# Patient Record
Sex: Male | Born: 1985 | Race: White | Hispanic: No | Marital: Married | Smoking: Current every day smoker
Health system: Southern US, Community
[De-identification: ages and names within clinical notes are randomized; demographics above are authoritative.]

## PROBLEM LIST (undated history)

## (undated) DIAGNOSIS — F909 Attention-deficit hyperactivity disorder, unspecified type: Secondary | ICD-10-CM

## (undated) DIAGNOSIS — F329 Major depressive disorder, single episode, unspecified: Secondary | ICD-10-CM

## (undated) DIAGNOSIS — F32A Depression, unspecified: Secondary | ICD-10-CM

## (undated) DIAGNOSIS — F4310 Post-traumatic stress disorder, unspecified: Secondary | ICD-10-CM

## (undated) HISTORY — PX: TONSILLECTOMY: SUR1361

---

## 2013-04-20 ENCOUNTER — Encounter (HOSPITAL_BASED_OUTPATIENT_CLINIC_OR_DEPARTMENT_OTHER): Payer: Self-pay | Admitting: Emergency Medicine

## 2013-04-20 ENCOUNTER — Emergency Department (HOSPITAL_BASED_OUTPATIENT_CLINIC_OR_DEPARTMENT_OTHER)
Admission: EM | Admit: 2013-04-20 | Discharge: 2013-04-21 | Disposition: A | Discharge status: Other Acute Inpatient Hospital | Payer: Self-pay | Attending: Emergency Medicine | Admitting: Emergency Medicine

## 2013-04-20 DIAGNOSIS — F909 Attention-deficit hyperactivity disorder, unspecified type: Secondary | ICD-10-CM | POA: Insufficient documentation

## 2013-04-20 DIAGNOSIS — F17200 Nicotine dependence, unspecified, uncomplicated: Secondary | ICD-10-CM | POA: Insufficient documentation

## 2013-04-20 DIAGNOSIS — F4310 Post-traumatic stress disorder, unspecified: Secondary | ICD-10-CM | POA: Insufficient documentation

## 2013-04-20 DIAGNOSIS — F3289 Other specified depressive episodes: Secondary | ICD-10-CM | POA: Insufficient documentation

## 2013-04-20 DIAGNOSIS — R45851 Suicidal ideations: Secondary | ICD-10-CM | POA: Insufficient documentation

## 2013-04-20 DIAGNOSIS — F32A Depression, unspecified: Secondary | ICD-10-CM

## 2013-04-20 DIAGNOSIS — F329 Major depressive disorder, single episode, unspecified: Secondary | ICD-10-CM

## 2013-04-20 HISTORY — DX: Post-traumatic stress disorder, unspecified: F43.10

## 2013-04-20 HISTORY — DX: Attention-deficit hyperactivity disorder, unspecified type: F90.9

## 2013-04-20 HISTORY — DX: Depression, unspecified: F32.A

## 2013-04-20 HISTORY — DX: Major depressive disorder, single episode, unspecified: F32.9

## 2013-04-20 LAB — CBC WITH DIFFERENTIAL/PLATELET
Basophils Absolute: 0 10*3/uL (ref 0.0–0.1)
Basophils Relative: 0 % (ref 0–1)
Eosinophils Absolute: 0.2 10*3/uL (ref 0.0–0.7)
Eosinophils Relative: 2 % (ref 0–5)
HCT: 40.1 % (ref 39.0–52.0)
HEMOGLOBIN: 13.5 g/dL (ref 13.0–17.0)
LYMPHS PCT: 18 % (ref 12–46)
Lymphs Abs: 1.7 10*3/uL (ref 0.7–4.0)
MCH: 29.9 pg (ref 26.0–34.0)
MCHC: 33.7 g/dL (ref 30.0–36.0)
MCV: 88.9 fL (ref 78.0–100.0)
MONO ABS: 0.8 10*3/uL (ref 0.1–1.0)
Monocytes Relative: 8 % (ref 3–12)
NEUTROS ABS: 6.8 10*3/uL (ref 1.7–7.7)
NEUTROS PCT: 72 % (ref 43–77)
Platelets: 274 10*3/uL (ref 150–400)
RBC: 4.51 MIL/uL (ref 4.22–5.81)
RDW: 13.5 % (ref 11.5–15.5)
WBC: 9.4 10*3/uL (ref 4.0–10.5)

## 2013-04-20 LAB — RAPID URINE DRUG SCREEN, HOSP PERFORMED
Amphetamines: NOT DETECTED
BARBITURATES: NOT DETECTED
Benzodiazepines: NOT DETECTED
COCAINE: NOT DETECTED
Opiates: NOT DETECTED
TETRAHYDROCANNABINOL: NOT DETECTED

## 2013-04-20 LAB — COMPREHENSIVE METABOLIC PANEL
ALBUMIN: 4.3 g/dL (ref 3.5–5.2)
ALK PHOS: 58 U/L (ref 39–117)
ALT: 10 U/L (ref 0–53)
AST: 13 U/L (ref 0–37)
BILIRUBIN TOTAL: 0.2 mg/dL — AB (ref 0.3–1.2)
BUN: 10 mg/dL (ref 6–23)
CHLORIDE: 105 meq/L (ref 96–112)
CO2: 26 meq/L (ref 19–32)
CREATININE: 0.8 mg/dL (ref 0.50–1.35)
Calcium: 9.2 mg/dL (ref 8.4–10.5)
GFR calc Af Amer: >90 mL/min (ref >90)
Glucose, Bld: 99 mg/dL (ref 70–99)
POTASSIUM: 4 meq/L (ref 3.7–5.3)
Sodium: 145 mEq/L (ref 137–147)
Total Protein: 7.1 g/dL (ref 6.0–8.3)

## 2013-04-20 LAB — SALICYLATE LEVEL

## 2013-04-20 LAB — ACETAMINOPHEN LEVEL

## 2013-04-20 LAB — ETHANOL: Alcohol, Ethyl (B): <11 mg/dL (ref 0–11)

## 2013-04-20 MED ORDER — NICOTINE 21 MG/24HR TD PT24
21.0000 mg | MEDICATED_PATCH | Freq: Once | TRANSDERMAL | Status: DC
  Administered 2013-04-20: 21 mg via TRANSDERMAL

## 2013-04-20 MED ORDER — NICOTINE 21 MG/24HR TD PT24
MEDICATED_PATCH | TRANSDERMAL | Status: AC
  Filled 2013-04-20: qty 1

## 2013-04-20 NOTE — ED Notes (Signed)
Pt requested his girlfriend be called and updated that [facility]. I did speak to Geoffery SpruceLeAnn (girlfriend) and she is aware of this.

## 2013-04-20 NOTE — ED Notes (Signed)
Telepsych set up in patient's room. 

## 2013-04-20 NOTE — BH Assessment (Signed)
Contacted Dr. Rosalia Hammersay to gather additional information about pt prior to beginning tele-assessment.  Contacted nurse Genella RifeSilvia to set up tele-assessment equipment.  Tele-assessment will be initiated.  Frank Cross, MSW, LCSW Triage Specialist [phone]
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
## 2013-04-20 NOTE — BH Assessment (Signed)
Clinician consulted with Alberteen SamFran Hobson NP who states Pt meets criteria for inpatient admission. La[facility]obbyLuwanda Daniels, [facility]. Alternative inpatient placement will be pursued by Noland FordyceMichelle Wilkinson, NT. Dr. Rosalia Hammersay was notified of recommendation and agreed.   Yaakov Guthrieelilah Stewart, MSW, LCSW Triage Specialist [phone]
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
## 2013-04-21 ENCOUNTER — Encounter (HOSPITAL_COMMUNITY): Payer: Self-pay | Admitting: *Deleted

## 2013-04-21 ENCOUNTER — Inpatient Hospital Stay (HOSPITAL_COMMUNITY)
Admission: AD | Admit: 2013-04-21 | Discharge: 2013-04-24 | DRG: 885 | Disposition: A | Discharge status: Home / Self Care | Payer: No Typology Code available for payment source | Source: Intra-hospital | Attending: Psychiatry | Admitting: Psychiatry

## 2013-04-21 DIAGNOSIS — F909 Attention-deficit hyperactivity disorder, unspecified type: Secondary | ICD-10-CM | POA: Diagnosis present

## 2013-04-21 DIAGNOSIS — R45851 Suicidal ideations: Secondary | ICD-10-CM

## 2013-04-21 DIAGNOSIS — F329 Major depressive disorder, single episode, unspecified: Secondary | ICD-10-CM

## 2013-04-21 DIAGNOSIS — F4310 Post-traumatic stress disorder, unspecified: Secondary | ICD-10-CM | POA: Diagnosis present

## 2013-04-21 DIAGNOSIS — Z79899 Other long term (current) drug therapy: Secondary | ICD-10-CM

## 2013-04-21 DIAGNOSIS — G4710 Hypersomnia, unspecified: Secondary | ICD-10-CM | POA: Diagnosis present

## 2013-04-21 DIAGNOSIS — F411 Generalized anxiety disorder: Secondary | ICD-10-CM | POA: Diagnosis present

## 2013-04-21 DIAGNOSIS — F332 Major depressive disorder, recurrent severe without psychotic features: Principal | ICD-10-CM | POA: Diagnosis present

## 2013-04-21 MED ORDER — ALUM & MAG HYDROXIDE-SIMETH 200-200-20 MG/5ML PO SUSP: 30.0000 mL | ORAL | Status: DC | PRN

## 2013-04-21 MED ORDER — IBUPROFEN 400 MG PO TABS: 600.0000 mg | ORAL_TABLET | Freq: Three times a day (TID) | ORAL | Status: DC | PRN

## 2013-04-21 MED ORDER — ACETAMINOPHEN 325 MG PO TABS: 650.0000 mg | ORAL_TABLET | ORAL | Status: DC | PRN

## 2013-04-21 MED ORDER — HYDROXYZINE HCL 25 MG PO TABS
25.0000 mg | ORAL_TABLET | Freq: Four times a day (QID) | ORAL | Status: DC | PRN
  Administered 2013-04-21 – 2013-04-23 (×3): 25 mg via ORAL
  Filled 2013-04-21: qty 1
  Filled 2013-04-21: qty 28
  Filled 2013-04-21 (×2): qty 1

## 2013-04-21 MED ORDER — TRAZODONE HCL 50 MG PO TABS
50.0000 mg | ORAL_TABLET | Freq: Every evening | ORAL | Status: DC | PRN
  Administered 2013-04-21 – 2013-04-23 (×3): 50 mg via ORAL
  Filled 2013-04-21 (×6): qty 1
  Filled 2013-04-21: qty 28
  Filled 2013-04-21 (×2): qty 1
  Filled 2013-04-21: qty 28
  Filled 2013-04-21 (×2): qty 1

## 2013-04-21 MED ORDER — ACETAMINOPHEN 325 MG PO TABS: 650.0000 mg | ORAL_TABLET | Freq: Four times a day (QID) | ORAL | Status: DC | PRN

## 2013-04-21 MED ORDER — MAGNESIUM HYDROXIDE 400 MG/5ML PO SUSP: 30.0000 mL | Freq: Every day | ORAL | Status: DC | PRN

## 2013-04-21 MED ORDER — NICOTINE 21 MG/24HR TD PT24
21.0000 mg | MEDICATED_PATCH | Freq: Every day | TRANSDERMAL | Status: DC
  Administered 2013-04-22: 21 mg via TRANSDERMAL
  Filled 2013-04-21 (×3): qty 1

## 2013-04-21 MED ORDER — ONDANSETRON HCL 4 MG PO TABS: 4.0000 mg | ORAL_TABLET | Freq: Three times a day (TID) | ORAL | Status: DC | PRN

## 2013-04-21 MED ORDER — LORAZEPAM 1 MG PO TABS: 1.0000 mg | ORAL_TABLET | Freq: Three times a day (TID) | ORAL | Status: DC | PRN

## 2013-04-21 NOTE — ED Notes (Signed)
Clothing and belongings inventoried, placed in a plastic bin with name on the outside.
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
## 2013-04-21 NOTE — ED Notes (Signed)
Called [facility] for transport. 

## 2013-04-21 NOTE — ED Notes (Signed)
Belongings given to Frank Cross with [facility] transportation.

## 2013-04-21 NOTE — Progress Notes (Signed)
Writer followed up on the initiated inpatient placement at the following hospitals:  1)[facility] - Writer left a message with the intake specialist Berton Lan(Elizabeth Starling)   2)[facility] - per [employer] CorporationColeen she did not receive the referral but the hospital does have one adult male bed.  Writer      set out the referral to [phone].  Coleen reports that they do have bed availiablity  3)[facility] - per, Dondra SpryGail the patient is logged in the book but they not have a copy of the referral.  Writer faxed the       referral to [phone].  Per Dondra SpryGail they do have bed availability.   4)[facility] - per, Arline Aspindy the patient has not been reviewed by the doctor.   5)[facility] - per, [place] their unit is at capacity.    6)[facility], per [place] there are no beds.   7)[facility] - per, Darlene their unit is at capacity.    8)[facility] is [facility], per, [place].

## 2013-04-21 NOTE — ED Notes (Signed)
[facility] [place].  Vitals done and recorded at the 2300 time

## 2013-04-21 NOTE — ED Notes (Signed)
Monitor set up for TTS.    Patient advised that they want to speak with patient.   BH called and stated to cancel while we were setting up.

## 2013-04-21 NOTE — Progress Notes (Signed)
MHT initiated inpatient placement at the following hospitals with bed availability:  1)[facility] 3)[facility] 4)[facility] 6)[facility]  Zeshan Sena L Alivia Cimino, MHT/NS

## 2013-04-21 NOTE — ED Provider Notes (Signed)
Patient has been accepted at [facility] by Dr. Elsie SaasJonnalagadda.   Dione Boozeavid Zera Markwardt, MD 04/21/13 [phone]

## 2013-04-21 NOTE — Progress Notes (Signed)
Pt has been having increased anxiety/agitation since coming on the unit.  Pt made phone calls to his parents stating he wanted to leave and his parents came to take pt home.  Staff explained to patient that he must see a MD and have a discharge order written before he can leave even though he signed voluntary.  Initially pt was argumentative with staff, but then was able to calm down on his own and listen to what staff was trying to get him to understand.  Pt was allowed to speak with his parents by phone before he agreed to take medication to help him relax so he could go to sleep.  Pt is in his room at this time in bed.  Pt remains safe with q15 minute checks.

## 2013-04-21 NOTE — ED Notes (Signed)
Patient oriented to room.  Sitter at bedside.

## 2013-04-22 ENCOUNTER — Encounter (HOSPITAL_COMMUNITY): Payer: Self-pay | Admitting: Family

## 2013-04-22 DIAGNOSIS — F411 Generalized anxiety disorder: Secondary | ICD-10-CM

## 2013-04-22 DIAGNOSIS — F332 Major depressive disorder, recurrent severe without psychotic features: Principal | ICD-10-CM

## 2013-04-22 MED ORDER — BUPROPION HCL ER (XL) 150 MG PO TB24
150.0000 mg | ORAL_TABLET | Freq: Every day | ORAL | Status: DC
  Administered 2013-04-23 – 2013-04-24 (×2): 150 mg via ORAL
  Filled 2013-04-22 (×4): qty 1
  Filled 2013-04-22: qty 14

## 2013-04-22 MED ORDER — SERTRALINE HCL 50 MG PO TABS
50.0000 mg | ORAL_TABLET | Freq: Every day | ORAL | Status: DC
  Filled 2013-04-22: qty 1

## 2013-04-22 MED ORDER — NICOTINE POLACRILEX 2 MG MT GUM
2.0000 mg | CHEWING_GUM | OROMUCOSAL | Status: DC
  Administered 2013-04-23 – 2013-04-24 (×7): 2 mg via ORAL
  Filled 2013-04-22 (×19): qty 1
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
## 2013-04-22 NOTE — Progress Notes (Addendum)
D:  Patient's self inventory sheet, patient sleeps well, good appetite, low energy level, improving attention span.  Denied depression, anxiety and hopelessness.  Denied withdrawals.  Denied SI.  Plans to live in mom's home after discharge and [facility].  Needs financial assistance to purchase medications after discharge. A:  Medications administered per MD orders.  Emotional support and encouragement given patient. R:  Denied SI and HI.  Denied A/V hallucinations.  Denied pain.  Will continue to monitor patient for safety with 15 minute checks.  Safety maintained.  Patient wrote note early this morning and gave to night shift nurse.  "I Frank Cross feel that I will be able to be released without any further problems.  I plan on staying with my parents.  I am calling first thing in the morning to [facility] to get an appt to talk to a therapist.  Thanks  Frank Cross"

## 2013-04-22 NOTE — ED Provider Notes (Signed)
Medical screening examination/treatment/procedure(s) were performed by non-physician practitioner and as supervising physician I was immediately available for consultation/collaboration.  EKG Interpretation   None         Alonia Dibuono M Ben Sanz, MD 04/22/13 0531 

## 2013-04-22 NOTE — Progress Notes (Signed)
The focus of this group is to educate the patient on the purpose and policies of crisis stabilization and provide a format to answer questions about their admission.  The group details unit policies and expectations of patients while admitted.  Patient did not attend 0900 nurse education orientation group this morning.  Patient stayed in bed during group.   

## 2013-04-22 NOTE — [facility] Group Notes (Signed)
[facility] LCSW Group Therapy      Feelings About Diagnosis 1:15 - 2:30 PM         04/22/2013  1:06 PM    Type of Therapy:  Group Therapy  Participation Level:  Minimal  Participation Quality:  Appropriate  Affect:  Appropriate  Cognitive:  Alert and Appropriate  Insight:  Developing/Improving and Engaged  Engagement in Therapy:  Developing/Improving and Engaged  Modes of Intervention:  Discussion, Education, Exploration, Problem-Solving, Rapport Building, Support  Summary of Progress/Problems:  Patient actively participated in group. Patient discussed past and present diagnosis and the effects it has had on  life.  Patient talked about family and society being judgmental and the stigma associated with having a mental health diagnosis.  He shared he has been relieved being here and learning he is not the only person with problems.  Wynn BankerHodnett, Robertlee Rogacki Hairston 04/22/2013  1:06 PM

## 2013-04-22 NOTE — Progress Notes (Signed)
Adult Psychoeducational Group Note  Date:  04/22/2013 Time:  8:00 pm  Group Topic/Focus:  Wrap-Up Group:   The focus of this group is to help patients review their daily goal of treatment and discuss progress on daily workbooks.  Participation Level:  Active  Participation Quality:  Appropriate and Sharing  Affect:  Appropriate  Cognitive:  Appropriate  Insight: Appropriate  Engagement in Group:  Engaged  Modes of Intervention:  Discussion, Education, Socialization and Support  Additional Comments:  Pt stated that he hopes to develop anger management skills and learn to manage his depression while in hospital. Pt stated that he can be very talkative once he gets to know people.   Laural BenesJohnson, Keeli Roberg 04/22/2013, 11:44 PM
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
## 2013-04-22 NOTE — [facility] Suicide Risk Assessment (Signed)
[facility] INPATIENT:  Family/Significant Other Suicide Prevention Education  Suicide Prevention Education:  Patient Refusal for Family/Significant Other Suicide Prevention Education: The patient Frank Cross has refused to provide written consent for family/significant other to be provided Family/Significant Other Suicide Prevention Education during admission and/or prior to discharge.  Physician notified.  Wynn BankerHodnett, Clarice Bonaventure Hairston 04/22/2013, 3:37 PM

## 2013-04-22 NOTE — Progress Notes (Signed)
Recreation Therapy Notes  Animal-Assisted Activity/Therapy (AAA/T) Program Checklist/Progress Notes Patient Eligibility Criteria Checklist & Daily Group note for Rec Tx Intervention  Date: 01.20.2015 Time: 2:45pm [address]    AAA/T Program Assumption of Risk Form signed by Patient/ or Parent Legal Guardian yes  Patient is free of allergies or sever asthma yes  Patient reports no fear of animals yes  Patient reports no history of cruelty to animals yes   Patient understands his/her participation is voluntary yes  Patient washes hands before animal contact yes  Patient washes hands after animal contact yes  Behavioral Response: Appropriate  Education: Hand Washing, Appropriate Animal Interaction   Education Outcome: Acknowledges understanding   Clinical Observations/Feedback: Patient interacted appropriately with peers and therapy dog team during session.   Marykay Lexenise L [place] Monds, LRT/CTRS  Shamecka Hocutt L 04/22/2013 4:21 PM

## 2013-04-22 NOTE — [facility] Group Notes (Deleted)
[facility] LCSW Group Therapy  Feelings Around Diagnosis 1:15 - 2:30             04/22/2013   1:05 PM     Type of Therapy:  Group Therapy  Participation Level:  Appropriate  Participation Quality:  Appropriate  Affect:  Appropriate  Cognitive:  Attentive Appropriate  Insight:  Engaged  Engagement in Therapy:  Engaged  Modes of Intervention:  Discussion Exploration Problem-Solving Supportive  Summary of Progress/Problems:  Wynn BankerHodnett, Alicia Seib Hairston 04/22/2013  1:05 PM

## 2013-04-23 DIAGNOSIS — F329 Major depressive disorder, single episode, unspecified: Secondary | ICD-10-CM

## 2013-04-23 NOTE — Progress Notes (Signed)
Pt reports he is doing much better and feels the meds are working well.  He says he has been able to get some sleep while he has been here.  He has a plan to begin seeing his therapist regularly.  He says he will stay with his parents for a while. He feels he still needs to work on his anger issues and how he deals with things that don't go his way.  Pt denies SI/HI/AV.  Pt makes his needs known to staff.  Support and encouragement offered.  Safety maintained with q15 minute checks.
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
## 2013-04-23 NOTE — Progress Notes (Signed)
Pt reports he is doing much better today.  After talking with staff today and having Wellbutrin added back to his med list, pt says he feels better about his situation.  He said he slept very well last night and wants the same medications tonight.  He denies SI/HI/AV.  He says he plans to follow up with [facility] in [place] and is going to stay with his parents until he gets his thoughts together.  He says he still loves his fiance, but that they need time apart.  He is more hopeful and is smiling as he talks with Clinical research associatewriter.  Pt makes his needs known to staff.  Support and encouragement offered.  Safety maintained with q15 minute checks.

## 2013-04-23 NOTE — Progress Notes (Signed)
[facility]  04/23/2013 3:04 PM Carlton AdamDouglas Epler  MRN:  [id number] Subjective:  Patient was admitted for major depressive disorder and anxiety and suicide attempt. Patient reportedly has conflict between him and his fiance and his parents. Patient has suffered with emotional problems and has been taking medication Zoloft which is not working and reportedly his medication Wellbutrin helps him better. Patient stated that his current medication regimen seems to be working well for him and want to continue without changes today. Patient has been participating in inpatient hospital program including group therapies and learning coping skills Patient has contract for safety while in the hospital. Patient sleep and appetite are getting better. Patient slept only 4.75 hours  last night Diagnosis:   DSM5: Schizophrenia Disorders:   Obsessive-Compulsive Disorders:   Trauma-Stressor Disorders:   Substance/Addictive Disorders:   Depressive Disorders:  Major Depressive Disorder - Severe (296.23)  Axis I: Major Depression, Recurrent severe  ADL's:  Intact  Sleep: Poor  Appetite:  Fair  Suicidal Ideation:  Patient had a suicide attempt by overdosing his medication and in several parts of her running out of  the road Homicidal Ideation:  Denied AEB (as evidenced by):  Psychiatric Specialty Exam: ROS patient has no panic symptoms or psychotic symptoms like auditory or visual hallucinations, delusions or paranoia.    Blood pressure 107/72, pulse 69, temperature 98 F (36.7 C), temperature source Oral, resp. rate 16, height 5\' 7"  (1.702 m), weight 76.204 kg (168 lb).Body mass index is 26.31 kg/(m^2).  General Appearance: Disheveled and Guarded, has normal musculoskeletal activity   Eye Contact::  Minimal  Speech:  Clear and Coherent, patient has no language difficulties or dysarthria   Volume:  Decreased  Mood:  Anxious and Depressed  Affect:  Depressed and Flat  Thought Process:  Goal  Directed and Intact  Orientation:  Full (Time, Place, and Person)  Thought Content:  Rumination  Suicidal Thoughts:  Yes.  without intent/plan  Homicidal Thoughts:  No  Memory:  Immediate;   Fair  Judgement:  Impaired  Insight:  Lacking  Psychomotor Activity:  Psychomotor Retardation  Concentration:  Fair, patient fund of knowledge is fair   Recall:  Fair  Akathisia:  NA  Handed:  Right  AIMS (if indicated):     Assets:  Communication Skills Desire for Improvement Financial Resources/Insurance Housing Intimacy Physical Health Resilience Social Support Transportation  Sleep:  Number of Hours: 4.75   Current Medications: Current Facility-Administered Medications  Medication Dose Route Frequency Provider Last Rate Last Dose  . acetaminophen (TYLENOL) tablet 650 mg  650 mg Oral Q6H PRN Kerry HoughSpencer E Simon, PA-C      . alum & mag hydroxide-simeth (MAALOX/MYLANTA) 200-200-20 MG/5ML suspension 30 mL  30 mL Oral Q4H PRN Kerry HoughSpencer E Simon, PA-C      . buPROPion (WELLBUTRIN XL) 24 hr tablet 150 mg  150 mg Oral Daily Beau FannyJohn C Withrow, FNP   150 mg at 04/23/13 0820  . hydrOXYzine (ATARAX/VISTARIL) tablet 25 mg  25 mg Oral Q6H PRN Kerry HoughSpencer E Simon, PA-C   25 mg at 04/22/13 2202  . magnesium hydroxide (MILK OF MAGNESIA) suspension 30 mL  30 mL Oral Daily PRN Kerry HoughSpencer E Simon, PA-C      . nicotine polacrilex (NICORETTE) gum 2 mg  2 mg Oral Q4H while awake Beau FannyJohn C Withrow, FNP   2 mg at 04/23/13 1304  . traZODone (DESYREL) tablet 50 mg  50 mg Oral QHS,MR X 1 Kerry HoughSpencer E Simon, PA-C  50 mg at 04/22/13 2202    Lab Results: No results found for this or any previous visit (from the past 48 hour(s)).  Physical Findings: AIMS: Facial and Oral Movements Muscles of Facial Expression: None, normal Lips and Perioral Area: None, normal Jaw: None, normal Tongue: None, normal,Extremity Movements Upper (arms, wrists, hands, fingers): None, normal Lower (legs, knees, ankles, toes): None, normal, Trunk  Movements Neck, shoulders, hips: None, normal, Overall Severity Severity of abnormal movements (highest score from questions above): None, normal Incapacitation due to abnormal movements: None, normal Patient's awareness of abnormal movements (rate only patient's report): No Awareness, Dental Status Current problems with teeth and/or dentures?: No Does patient usually wear dentures?: No  CIWA:  CIWA-Ar Total: 1 COWS:  COWS Total Score: 1  Treatment Plan Summary: Daily contact with patient to assess and evaluate symptoms and progress in treatment Medication management for depression, anxiety and loss of appetite and sleep which seems to be getting better  Plan: Treatment Plan/Recommendations:   1. Continue for crisis management and stabilization. 2. Medication management to reduce current symptoms to base line and improve the patient's overall level of functioning.  Continue Wellbutrin XL 150 mg daily for depression and a hydroxyzine 25 mg for anxiety and trazodone 50 mg for insomnia 3. Treat health problems as indicated. 4. Develop treatment plan to decrease risk of relapse upon discharge and to reduce the need for readmission. 5. Psycho-social education regarding relapse prevention and self care. 6. Health care follow up as needed for medical problems. 7.  Disposition Plans Are in Progress   Medical Decision Making Problem Points:  Established problem, worsening (2), New problem, with no additional work-up planned (3), Review of last therapy session (1) and Review of psycho-social stressors (1) Data Points:  Review or order clinical lab tests (1) Review or order medicine tests (1) Review of medication regiment & side effects (2) Review of new medications or change in dosage (2)  I certify that inpatient services furnished can reasonably be expected to improve the patient's condition.   Dayane Hillenburg,JANARDHAHA R. 04/23/2013, 3:04 PM

## 2013-04-23 NOTE — Progress Notes (Signed)
[facility] Group Notes:  (Nursing/MHT/Case Management/Adjunct)  Date:  04/23/2013  Time:  9:39 PM  Type of Therapy:  Group Therapy  Participation Level:  Active  Participation Quality:  Appropriate  Affect:  Appropriate  Cognitive:  Appropriate  Insight:  Appropriate  Engagement in Group:  Engaged  Modes of Intervention:  Discussion  Summary of Progress/Problems:The patient expressed that he had a great day.The patient said that he did nit like that his family visited him today.  Octavio Mannshigpen, Deedra Pro Lee 04/23/2013, 9:39 PM

## 2013-04-23 NOTE — Progress Notes (Signed)
  D: Patient denies SI/HI and A/V hallucinations;  rates depression as 0/10; rates hopelessness 0/10; rates anxiety as -/10;   A: Monitored q 15 minutes; patient encouraged to attend groups; patient educated about medications; patient given medications per physician orders; patient encouraged to express feelings and/or concerns  R: Patient is animated and appropriate to circumstances; patient is pleasant and cooperative;  patient's interaction with staff and peers is appropriate; patient was able to set goal to talk with staff 1:1 when having feelings of SI; patient is taking medications as prescribed and tolerating medications; patient is attending all groups and engaging

## 2013-04-23 NOTE — [facility] Group Notes (Signed)
[facility]   04/23/2013 9:44 AM    Participation Quality:  Appropraite  Mood/Affect:  Appropriate  Depression Rating:  1  Anxiety Rating:  1  Thoughts of Suicide:  No  Will you contract for safety?   NA  Current AVH:  No  Plan for Discharge/Comments:  Patient attended discharge planning group and actively participated in group.  He reports doing well and hopes to discharge soon.  He [facility] [place] [facility] [place]. CSW provided all participants with daily workbook.   Transportation Means: Patient has transportation.   Supports:  Patient has a support system.   Ranell Finelli, Joesph JulyQuylle Hairston
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
## 2013-04-23 NOTE — [facility] Group Notes (Signed)
[facility] LCSW Group Therapy  Emotional Regulation 1:15 - 2: 30 PM        04/23/2013  3:31 PM   Type of Therapy:  Group Therapy  Participation Level:  Appropriate  Participation Quality:  Appropriate  Affect:  Appropriate  Cognitive:  Attentive Appropriate  Insight:  Developing/Improving Engaged  Engagement in Therapy:  Developing/Improving Engaged  Modes of Intervention:  Discussion Exploration Problem-Solving Supportive  Summary of Progress/Problems:  Group topic was emotional regulations.  Patient participated in the discussion and was able to identify an emotion that needed to regulated.  He shared he has problems with anger and stated the anger comes from things that have happened in his past.  Paient did not want to elaborate on past events in group.  Patient was able to identify approprite coping skills.  Wynn BankerHodnett, Woodfin Kiss Hairston 04/23/2013 3:31 PM

## 2013-04-23 NOTE — Progress Notes (Signed)
NUTRITION ASSESSMENT  Pt identified as at risk on the Malnutrition Screen Tool  INTERVENTION: 1. Educated patient on the importance of nutrition and encouraged intake of food and beverages.  Provided patient with the following handouts, "Health Families Making Healthy Choices", and "let's eat for the health of it" from Eat Right.org and discussed overall healthy eating for himself and his children.   2. Discussed weight goals. 3. Supplements: none at this time.  NUTRITION DIAGNOSIS: Unintentional weight loss related to sub-optimal intake as evidenced by pt report.   Goal: Pt to meet >/= 90% of their estimated nutrition needs.  Monitor:  PO intake  Assessment:  Patient admitted with depression, SI, HA, PTSD.  Reports that his appetite has improved and is now eating well.  States that he had lost weight somewhat purposefully from  UBW of 215 lbs in Aug/Sept secondary to knowing that he should be a healthier weight.  Patient requested healthy eating information to help him maintain his current weight as well as help his children be more healthy.  28 y.o. male  Height: Ht Readings from Last 1 Encounters:  04/21/13 5\' 7"  (1.702 m)    Weight: Wt Readings from Last 1 Encounters:  04/21/13 168 lb (76.204 kg)    Weight Hx: Wt Readings from Last 10 Encounters:  04/21/13 168 lb (76.204 kg)  04/20/13 200 lb (90.719 kg)    BMI:  Body mass index is 26.31 kg/(m^2). Pt meets criteria for overweight based on current BMI. Weight appears appropriate for height based on muscle mass.  Estimated Nutritional Needs: Kcal: 25-30 kcal/kg Protein: > 1 gram protein/kg Fluid: 1 ml/kcal  Diet Order: General Pt is also offered choice of unit snacks mid-morning and mid-afternoon.  Pt is eating as desired.   Lab results and medications reviewed.   Oran ReinLaura Jobe, RD, LDN Clinical Inpatient Dietitian Pager:  [phone] Weekend and after hours pager:  [phone]

## 2013-04-24 MED ORDER — NICOTINE POLACRILEX 2 MG MT GUM: 2.0000 mg | CHEWING_GUM | OROMUCOSAL | Status: DC

## 2013-04-24 MED ORDER — TRAZODONE HCL 50 MG PO TABS: 50.0000 mg | ORAL_TABLET | Freq: Every evening | ORAL | Status: DC | PRN

## 2013-04-24 MED ORDER — HYDROXYZINE HCL 25 MG PO TABS: 25.0000 mg | ORAL_TABLET | Freq: Four times a day (QID) | ORAL | Status: DC | PRN

## 2013-04-24 MED ORDER — BUPROPION HCL ER (XL) 150 MG PO TB24
150.0000 mg | ORAL_TABLET | Freq: Every day | ORAL | Status: DC
Start: 2013-04-24 — End: 2015-05-13

## 2013-04-24 NOTE — Progress Notes (Signed)
Discharge Note:  Patient discharged to family member to return home by charge nurse.  Patient received all his belongings, clothing, toiletries, miscellaneous items.  Suicide prevention information given and discussed with patient who stated he understood and had no questions.  Patient stated he [facility] all [facility].  Patient denied SI and HI.  Denied A/V hallucinations.   Denied pain.

## 2013-04-24 NOTE — Progress Notes (Signed)
Recreation Therapy Notes  Date: 01.21.2015 Time: 3:00pm Location: [address] Day[address]   Group Topic: Communication, Team Building, Problem Solving  Goal Area(s) Addresses:  Patient will effectively work with peer towards shared goal.  Patient will identify skill used to make activity successful.  Patient will identify how skills used during activity can be used to reach post d/c goals.   Behavioral Response: Appropriate   Intervention: Problem Solving Activity  Activity: Landing Pad. In teams patients were given 12 plastic drinking straws and a length of masking tape. Using the materials provided patients were asked to build a landing pad to catch a golf ball dropped from approximately 6 feet in the air.   Education: Pharmacist, communityocial Skills, Building control surveyorDischarge Planning.   Education Outcome: Acknowledges understanding  Clinical Observations/Feedback: Patient actively engaged in group activity, sharing ideas with teammates and assisting with construction of team's landing pad. Patient contributed to group discussion, identifying qualities used to guide his decision making, patient additionally identified positive impact using group skills effectively could have on his life.   Marykay Lexenise L Sidra Oldfield, LRT/CTRS  Jerred Zaremba L 04/24/2013 9:04 AM

## 2013-04-24 NOTE — Progress Notes (Signed)
[facility] Adult Case Management Discharge Plan :  Will you be returning to the same living situation after discharge: Yes,  Patient is returning to his home. At discharge, do you have transportation home?:Yes,  Mother advised she will transport patient home. Do you have the ability to pay for your medications:No.  Patient needs assistance with indigent medications   Release of information consent forms completed and in the chart;  Patient's signature needed at discharge.  Patient to Follow up at: Follow-up Information   Follow up with Dr. Cammy CopaAdegoroye - [facility] On 04/30/2013. (You are scheduled with Dr. Cammy CopaAdegoroye on Wednesday, Janaury 28, 2015 at 3:00 PM)    Contact information:   [address] [place], [place]   [address]  [phone]      Follow up with Patsy Lagerindy Gierko - [facility]. (No call back from therapist.  Please contact her for follow up appointment.)    Contact information:   [address] [place], [place]   [address]  [phone]      Patient denies SI/HI:   Patient no longer endorsing SI/HI or other thoughts of self harm.   Safety Planning and Suicide Prevention discussed:  .Reviewed with all patients during discharge planning group  Frank Cross, Frank JulyQuylle Cross 04/24/2013, 11:12 AM

## 2013-04-24 NOTE — [facility] Suicide Risk Assessment (Signed)
Suicide Risk Assessment  Discharge Assessment     Demographic Factors:  Male, Adolescent or young adult, Caucasian, Low socioeconomic status and Unemployed  Mental Status Per Nursing Assessment::   On Admission:  Self-harm thoughts (Pt denies thoughts now)  Current Mental Status by Physician: Mental Status Examination: Patient appeared as per his stated age, casually dressed, and fairly groomed, and maintaining good eye contact. Patient has good mood and his affect was constricted. He has normal rate, rhythm, and volume of speech. His thought process is linear and goal directed. Patient has denied suicidal, homicidal ideations, intentions or plans. Patient has no evidence of auditory or visual hallucinations, delusions, and paranoia. Patient has fair insight judgment and impulse control.  Loss Factors: Financial problems/change in socioeconomic status  Historical Factors: NA  Risk Reduction Factors:   Sense of responsibility to family, Religious beliefs about death, Living with another person, especially a relative, Positive social support, Positive therapeutic relationship and Positive coping skills or problem solving skills  Continued Clinical Symptoms:  Depression:   Recent sense of peace/wellbeing Unstable or Poor Therapeutic Relationship Previous Psychiatric Diagnoses and Treatments  Cognitive Features That Contribute To Risk:  Polarized thinking    Suicide Risk:  Minimal: No identifiable suicidal ideation.  Patients presenting with no risk factors but with morbid ruminations; may be classified as minimal risk based on the severity of the depressive symptoms  Discharge Diagnoses:   AXIS I:  Major Depression, Recurrent severe AXIS II:  Deferred AXIS III:   Past Medical History  Diagnosis Date  . Depression   . Post-traumatic stress disorder   . ADHD (attention deficit hyperactivity disorder)    AXIS IV:  other psychosocial or environmental problems, problems related to  social environment and problems with primary support group AXIS V:  61-70 mild symptoms  Plan Of Care/Follow-up recommendations:  Activity:  As tolerated Diet:  Regular  Is patient on multiple antipsychotic therapies at discharge:  No   Has Patient had three or more failed trials of antipsychotic monotherapy by history:  No  Recommended Plan for Multiple Antipsychotic Therapies: NA  Frank Cross,Frank Cross. 04/24/2013, 11:50 AM

## 2013-04-24 NOTE — [facility] Suicide Risk Assessment (Signed)
[facility] INPATIENT:  Family/Significant Other Suicide Prevention Education  Suicide Prevention Education:  Education Completed; Frank BadeMarie Cross, Mother, [phone]; has been identified by the patient as the family member/significant other with whom the patient will be residing, and identified as the person(s) who will aid the patient in the event of a mental health crisis (suicidal ideations/suicide attempt).  With written consent from the patient, the family member/significant other has been provided the following suicide prevention education, prior to the and/or following the discharge of the patient.  The suicide prevention education provided includes the following:  Suicide risk factors  Suicide prevention and interventions  National Suicide Hotline telephone number  [facility] assessment telephone number  [facility] Emergency Assistance 911  [facility] and/or Residential Mobile Crisis Unit telephone number  Request made of family/significant other to: Remove weapons (e.g., guns, rifles, knives), all items previously/currently identified as safety concern.  Mother advised patient does not have access to weapons.   Remove drugs/medications (over-the-counter, prescriptions, illicit drugs), all items previously/currently identified as a safety concern.  The family member/significant other verbalizes understanding of the suicide prevention education information provided.  The family member/significant other agrees to remove the items of safety concern listed above.  Frank Cross, Frank Cross 04/24/2013, 8:45 AM
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
## 2013-04-24 NOTE — Progress Notes (Signed)
The focus of this group is to educate the patient on the purpose and policies of crisis stabilization and provide a format to answer questions about their admission.  The group details unit policies and expectations of patients while admitted.  Patient attended 0900 nurse education orientation group this morning.  Patient actively participated, appropriate affect, alert, appropriate insight and engagement.  Today patient will work on 3 goals for discharge.  

## 2013-04-24 NOTE — Progress Notes (Signed)
D:  Patient's self inventory sheet, patient sleeps well, good appetite, normal energy level, good attention span.  Rated depression and hopeless 1.  Denied withdrawals.  Denied SI.  Denied physical problems.   Plans to take meds and do the follow ups after discharge.  Does have discharge plans.  No problems taking medications after discharge. A:  Medications administered per MD orders.  Emotional support and encouragement given patient. R:  Denied SI and HI.  Denied A/V hallucinations.  Denied pain.  Will continue to monitor patient for safety with 15 minute checks.  Safety maintained.

## 2013-04-29 NOTE — Progress Notes (Signed)
Patient Discharge Instructions:  After Visit Summary (AVS):   Faxed to:  04/29/13 Discharge Summary Note:   Faxed to:  04/29/13 Psychiatric Admission Assessment Note:   Faxed to:  04/29/13 Suicide Risk Assessment - Discharge Assessment:   Faxed to:  04/29/13 Faxed/Sent to the Next Level Care provider:  04/29/13 Faxed to [facility] @ [phone]  Jerelene ReddenSheena E Eagle, 04/29/2013, 3:40 PM

## 2013-11-07 ENCOUNTER — Emergency Department (HOSPITAL_COMMUNITY)
Admission: EM | Admit: 2013-11-07 | Discharge: 2013-11-07 | Disposition: A | Discharge status: Home / Self Care | Payer: Self-pay | Attending: Emergency Medicine | Admitting: Emergency Medicine

## 2013-11-07 ENCOUNTER — Encounter (HOSPITAL_COMMUNITY): Payer: Self-pay | Admitting: Emergency Medicine

## 2013-11-07 DIAGNOSIS — S61509A Unspecified open wound of unspecified wrist, initial encounter: Secondary | ICD-10-CM | POA: Insufficient documentation

## 2013-11-07 DIAGNOSIS — F17200 Nicotine dependence, unspecified, uncomplicated: Secondary | ICD-10-CM | POA: Insufficient documentation

## 2013-11-07 DIAGNOSIS — F3289 Other specified depressive episodes: Secondary | ICD-10-CM | POA: Insufficient documentation

## 2013-11-07 DIAGNOSIS — X789XXA Intentional self-harm by unspecified sharp object, initial encounter: Secondary | ICD-10-CM | POA: Insufficient documentation

## 2013-11-07 DIAGNOSIS — F329 Major depressive disorder, single episode, unspecified: Secondary | ICD-10-CM | POA: Insufficient documentation

## 2013-11-07 DIAGNOSIS — F909 Attention-deficit hyperactivity disorder, unspecified type: Secondary | ICD-10-CM | POA: Insufficient documentation

## 2013-11-07 DIAGNOSIS — Z79899 Other long term (current) drug therapy: Secondary | ICD-10-CM | POA: Insufficient documentation

## 2013-11-07 DIAGNOSIS — S61512A Laceration without foreign body of left wrist, initial encounter: Secondary | ICD-10-CM
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
## 2013-11-07 NOTE — ED Notes (Signed)
Pt resting in room, father in the lobby. Waiting on TTS consult and dispostion.

## 2013-11-07 NOTE — ED Provider Notes (Signed)
CSN: [id number]     Arrival date & time 11/07/13  0017 History   First MD Initiated Contact with Patient 11/07/13 0031     Chief Complaint  Patient presents with  . Extremity Laceration     (Consider location/radiation/quality/duration/timing/severity/associated sxs/prior Treatment) HPI  Frank Cross This 28 year old male brought in by EMS for self-inflicted laceration. He has a past medical history of PTSD and ADHD. The patient states that he has been under significant stress at home as he has been unable to find a job and he and his wife are just affected. He states he went to talk with her tonight and there was some verbal disagreement. Patient states that he became angry and normally releases his anger by punching things like a wall. He states that he occasionally blacks out when he's very angry. Tonight he states that he became angry and blacked out at which point he cut his wrist. Patient has never had any self-inflicted injuries previously. No previous history of cutting. Patient is up-to-date on his tetanus vaccination. He denies any suicidal ideation, homicidal ideation or audiovisual hallucinations. Patient denies any alcohol or drug abuse. Past Medical History  Diagnosis Date  . Depression   . Post-traumatic stress disorder   . ADHD (attention deficit hyperactivity disorder)    Past Surgical History  Procedure Laterality Date  . Tonsillectomy     History reviewed. No pertinent family history. History  Substance Use Topics  . Smoking status: Current Every Day Smoker -- 1.50 packs/day for 15 years  . Smokeless tobacco: Not on file  . Alcohol Use: 0.0 oz/week    3-4 Cans of beer per week     Comment: Occasionally drinker-says he is going to stop because it doesn't help    Review of Systems  Ten systems reviewed and are negative for acute change, except as noted in the HPI.    Allergies  Review of patient's allergies indicates no known allergies.  Home Medications    Prior to Admission medications   Medication Sig Start Date End Date Taking? Authorizing Provider  buPROPion (WELLBUTRIN XL) 150 MG 24 hr tablet Take 1 tablet (150 mg total) by mouth daily. 04/24/13  Yes Beau Fanny, FNP  hydrOXYzine (ATARAX/VISTARIL) 25 MG tablet Take 1 tablet (25 mg total) by mouth every 6 (six) hours as needed for anxiety. 04/24/13  Yes Beau Fanny, FNP  traZODone (DESYREL) 50 MG tablet Take 1 tablet (50 mg total) by mouth at bedtime and may repeat dose one time if needed. 04/24/13  Yes John C Withrow, FNP   BP 131/77  Pulse 100  Temp(Src) 98.3 F (36.8 C) (Oral)  Resp 20  Ht 5\' 7"  (1.702 m)  Wt 180 lb (81.647 kg)  BMI 28.19 kg/m2  SpO2 98% Physical Exam  Nursing note and vitals reviewed. Constitutional: He appears well-developed and well-nourished. No distress.  HENT:  Head: Normocephalic and atraumatic.  Eyes: Conjunctivae are normal. No scleral icterus.  Neck: Normal range of motion. Neck supple.  Cardiovascular: Normal rate, regular rhythm and normal heart sounds.   Pulmonary/Chest: Effort normal and breath sounds normal. No respiratory distress.  Abdominal: Soft. There is no tenderness.  Musculoskeletal: He exhibits no edema.  Left hand examination is done. Patient has full strength and range of motion of the hands.  Neurological: He is alert.  Skin: Skin is warm and dry. He is not diaphoretic.  5 cm superficial laceration to the left wrist involving skin and adipose tissue. The involvement noted.  Psychiatric: His behavior is normal.    ED Course  Procedures (including critical care time) Labs Review Labs Reviewed - No data to display  Imaging Review No results found.   EKG Interpretation None      LACERATION REPAIR Performed by: Arthor CaptainHarris, Syndey Jaskolski Authorized by: Arthor CaptainHarris, Milessa Hogan Consent: Verbal consent obtained. Risks and benefits: risks, benefits and alternatives were discussed Consent given by: patient Patient identity confirmed:  provided demographic data Prepped and Draped in normal sterile fashion Wound explored  Laceration Location: left wrist  Laceration Length: 5 cm  No Foreign Bodies seen or palpated  Anesthesia: local infiltration  Local anesthetic: lidocaine 1 % w/o epinephrine  Anesthetic total: 3.5 ml  Irrigation method: syringe Amount of cleaning: standard  Skin closure: 6.0 prolene  Number of sutures: 5  Technique: RI  Patient tolerance: Patient tolerated the procedure well with no immediate complications.   MDM   Final diagnoses:  None    Patient calm and collected here in the emergency department. He denies any thoughts of suicide. No history of self-harm. I have asked for a TTS consult on the patient. He is up-to-date on his tetanus vaccination   2:48 AM Patient seen and assessed by TTS. Patient was able to contract for safety. That he is very low risk for any completed suicides. The patient will return to the ED or urgent care for suture removal in 7 days. Discussed reasons to seek immediate care at the emergency department. Patient was given outpatient resource by social work.  Arthor CaptainAbigail Carson Meche, PA-C 11/07/13 [phone]

## 2013-11-07 NOTE — ED Provider Notes (Signed)
Medical screening examination/treatment/procedure(s) were performed by non-physician practitioner and as supervising physician I was immediately available for consultation/collaboration.   EKG Interpretation None      Devoria AlbeIva Martine Bleecker, MD, Armando GangFACEP   Ward GivensIva L Kollyns Mickelson, MD 11/07/13 [phone]

## 2013-11-07 NOTE — Discharge Instructions (Signed)
WOUND CARE Please have your stitches/staples removed in 7 or sooner if you have concerns. You may do this at any available urgent care or at your primary care doctor's office.  Keep area clean and dry for 24 hours. Do not remove bandage, if applied.  After 24 hours, remove bandage and wash wound gently with mild soap and warm water. Reapply a new bandage after cleaning wound, if directed.  Continue daily cleansing with soap and water until stitches/staples are removed.  Do not apply any ointments or creams to the wound while stitches/staples are in place, as this may cause delayed healing.  Seek medical careif you experience any of the following signs of infection: Swelling, redness, pus drainage, streaking, fever >101.0 F  Seek care if you experience excessive bleeding that does not stop after 15-20 minutes of constant, firm pressure.  Laceration Care, Adult A laceration is a cut or lesion that goes through all layers of the skin and into the tissue just beneath the skin. TREATMENT  Some lacerations may not require closure. Some lacerations may not be able to be closed due to an increased risk of infection. It is important to see your caregiver as soon as possible after an injury to minimize the risk of infection and maximize the opportunity for successful closure. If closure is appropriate, pain medicines may be given, if needed. The wound will be cleaned to help prevent infection. Your caregiver will use stitches (sutures), staples, wound glue (adhesive), or skin adhesive strips to repair the laceration. These tools bring the skin edges together to allow for faster healing and a better cosmetic outcome. However, all wounds will heal with a scar. Once the wound has healed, scarring can be minimized by covering the wound with sunscreen during the day for 1 full year. HOME CARE INSTRUCTIONS  For sutures or staples:  Keep the wound clean and dry.  If you were given a bandage  (dressing), you should change it at least once a day. Also, change the dressing if it becomes wet or dirty, or as directed by your caregiver.  Wash the wound with soap and water 2 times a day. Rinse the wound off with water to remove all soap. Pat the wound dry with a clean towel.  After cleaning, apply a thin layer of the antibiotic ointment as recommended by your caregiver. This will help prevent infection and keep the dressing from sticking.  You may shower as usual after the first 24 hours. Do not soak the wound in water until the sutures are removed.  Only take over-the-counter or prescription medicines for pain, discomfort, or fever as directed by your caregiver.  Get your sutures or staples removed as directed by your caregiver. For skin adhesive strips:  Keep the wound clean and dry.  Do not get the skin adhesive strips wet. You may bathe carefully, using caution to keep the wound dry.  If the wound gets wet, pat it dry with a clean towel.  Skin adhesive strips will fall off on their own. You may trim the strips as the wound heals. Do not remove skin adhesive strips that are still stuck to the wound. They will fall off in time. For wound adhesive:  You may briefly wet your wound in the shower or bath. Do not soak or scrub the wound. Do not swim. Avoid periods of heavy perspiration until the skin adhesive has fallen off on its own. After showering or bathing, gently pat the wound dry with a clean  towel.  Do not apply liquid medicine, cream medicine, or ointment medicine to your wound while the skin adhesive is in place. This may loosen the film before your wound is healed.  If a dressing is placed over the wound, be careful not to apply tape directly over the skin adhesive. This may cause the adhesive to be pulled off before the wound is healed.  Avoid prolonged exposure to sunlight or tanning lamps while the skin adhesive is in place. Exposure to ultraviolet light in the first  year will darken the scar.  The skin adhesive will usually remain in place for 5 to 10 days, then naturally fall off the skin. Do not pick at the adhesive film. You may need a tetanus shot if:  You cannot remember when you had your last tetanus shot.  You have never had a tetanus shot. If you get a tetanus shot, your arm may swell, get red, and feel warm to the touch. This is common and not a problem. If you need a tetanus shot and you choose not to have one, there is a rare chance of getting tetanus. Sickness from tetanus can be serious. SEEK MEDICAL CARE IF:   You have redness, swelling, or increasing pain in the wound.  You see a red line that goes away from the wound.  You have yellowish-white fluid (pus) coming from the wound.  You have a fever.  You notice a bad smell coming from the wound or dressing.  Your wound breaks open before or after sutures have been removed.  You notice something coming out of the wound such as wood or glass.  Your wound is on your hand or foot and you cannot move a finger or toe. SEEK IMMEDIATE MEDICAL CARE IF:   Your pain is not controlled with prescribed medicine.  You have severe swelling around the wound causing pain and numbness or a change in color in your arm, hand, leg, or foot.  Your wound splits open and starts bleeding.  You have worsening numbness, weakness, or loss of function of any joint around or beyond the wound.  You develop painful lumps near the wound or on the skin anywhere on your body. MAKE SURE YOU:   Understand these instructions.  Will watch your condition.  Will get help right away if you are not doing well or get worse. Document Released: 03/20/2005 Document Revised: 06/12/2011 Document Reviewed: 09/13/2010 [facility] Patient Information 2015 [place], [place]. This information is not intended to replace advice given to you by your health care provider. Make sure you discuss any questions you have with your health  care provider. Depression Depression refers to feeling sad, low, down in the dumps, blue, gloomy, or empty. In general, there are two kinds of depression: 1. Normal sadness or normal grief. This kind of depression is one that we all feel from time to time after upsetting life experiences, such as the loss of a job or the ending of a relationship. This kind of depression is considered normal, is short lived, and resolves within a few days to 2 weeks. Depression experienced after the loss of a loved one (bereavement) often lasts longer than 2 weeks but normally gets better with time. 2. Clinical depression. This kind of depression lasts longer than normal sadness or normal grief or interferes with your ability to function at home, at work, and in school. It also interferes with your personal relationships. It affects almost every aspect of your life. Clinical depression is  an illness. Symptoms of depression can also be caused by conditions other than those mentioned above, such as:  Physical illness. Some physical illnesses, including underactive thyroid gland (hypothyroidism), severe anemia, specific types of cancer, diabetes, uncontrolled seizures, heart and lung problems, strokes, and chronic pain are commonly associated with symptoms of depression.  Side effects of some prescription medicine. In some people, certain types of medicine can cause symptoms of depression.  Substance abuse. Abuse of alcohol and illicit drugs can cause symptoms of depression. SYMPTOMS Symptoms of normal sadness and normal grief include the following:  Feeling sad or crying for short periods of time.  Not caring about anything (apathy).  Difficulty sleeping or sleeping too much.  No longer able to enjoy the things you used to enjoy.  Desire to be by oneself all the time (social isolation).  Lack of energy or motivation.  Difficulty concentrating or remembering.  Change in appetite or weight.  Restlessness or  agitation. Symptoms of clinical depression include the same symptoms of normal sadness or normal grief and also the following symptoms:  Feeling sad or crying all the time.  Feelings of guilt or worthlessness.  Feelings of hopelessness or helplessness.  Thoughts of suicide or the desire to harm yourself (suicidal ideation).  Loss of touch with reality (psychotic symptoms). Seeing or hearing things that are not real (hallucinations) or having false beliefs about your life or the people around you (delusions and paranoia). DIAGNOSIS  The diagnosis of clinical depression is usually based on how bad the symptoms are and how long they have lasted. Your health care provider will also ask you questions about your medical history and substance use to find out if physical illness, use of prescription medicine, or substance abuse is causing your depression. Your health care provider may also order blood tests. TREATMENT  Often, normal sadness and normal grief do not require treatment. However, sometimes antidepressant medicine is given for bereavement to ease the depressive symptoms until they resolve. The treatment for clinical depression depends on how bad the symptoms are but often includes antidepressant medicine, counseling with a mental health professional, or both. Your health care provider will help to determine what treatment is best for you. Depression caused by physical illness usually goes away with appropriate medical treatment of the illness. If prescription medicine is causing depression, talk with your health care provider about stopping the medicine, decreasing the dose, or changing to another medicine. Depression caused by the abuse of alcohol or illicit drugs goes away when you stop using these substances. Some adults need professional help in order to stop drinking or using drugs. SEEK IMMEDIATE MEDICAL CARE IF:  You have thoughts about hurting yourself or others.  You lose touch  with reality (have psychotic symptoms).  You are taking medicine for depression and have a serious side effect. FOR MORE INFORMATION  National Alliance on Mental Illness: www.nami.[employer] of Mental Health: http://www.maynard.net/ Document Released: 03/17/2000 Document Revised: 08/04/2013 Document Reviewed: 06/19/2011 [facility] Patient Information 2015 [place], [place]. This information is not intended to replace advice given to you by your health care provider. Make sure you discuss any questions you have with your health care provider.

## 2013-11-07 NOTE — BH Assessment (Signed)
Assessment completed. Consulted with Janann Augustori  Burkett, NP who recommended that he sign a Engineer, manufacturing systemssafety contract and be provided with outpatient resources. Arthor CaptainAbigail Harris, PA-C has been notified of the recommendations.

## 2013-11-07 NOTE — ED Notes (Signed)
Pt states that he cut his wrist because he was angry and decided to cut himself instead of punching something. [place] him mad so he cut his wrist. EMS not sure if wife is coming to the ED.

## 2015-05-13 ENCOUNTER — Emergency Department (HOSPITAL_BASED_OUTPATIENT_CLINIC_OR_DEPARTMENT_OTHER)
Admission: EM | Admit: 2015-05-13 | Discharge: 2015-05-13 | Disposition: A | Discharge status: Home / Self Care | Payer: Self-pay | Attending: Emergency Medicine | Admitting: Emergency Medicine

## 2015-05-13 ENCOUNTER — Encounter (HOSPITAL_BASED_OUTPATIENT_CLINIC_OR_DEPARTMENT_OTHER): Payer: Self-pay

## 2015-05-13 DIAGNOSIS — F17200 Nicotine dependence, unspecified, uncomplicated: Secondary | ICD-10-CM | POA: Insufficient documentation

## 2015-05-13 DIAGNOSIS — Y998 Other external cause status: Secondary | ICD-10-CM | POA: Insufficient documentation

## 2015-05-13 DIAGNOSIS — Y9289 Other specified places as the place of occurrence of the external cause: Secondary | ICD-10-CM | POA: Insufficient documentation

## 2015-05-13 DIAGNOSIS — S83411A Sprain of medial collateral ligament of right knee, initial encounter: Secondary | ICD-10-CM | POA: Insufficient documentation

## 2015-05-13 DIAGNOSIS — X58XXXA Exposure to other specified factors, initial encounter: Secondary | ICD-10-CM | POA: Insufficient documentation

## 2015-05-13 DIAGNOSIS — Y9372 Activity, wrestling: Secondary | ICD-10-CM | POA: Insufficient documentation

## 2015-05-13 DIAGNOSIS — Z8659 Personal history of other mental and behavioral disorders: Secondary | ICD-10-CM | POA: Insufficient documentation

## 2015-05-13 MED ORDER — HYDROCODONE-ACETAMINOPHEN 5-325 MG PO TABS: 1.0000 | ORAL_TABLET | Freq: Four times a day (QID) | ORAL | Status: DC | PRN

## 2015-05-13 MED ORDER — HYDROMORPHONE HCL 1 MG/ML IJ SOLN
2.0000 mg | Freq: Once | INTRAMUSCULAR | Status: AC
  Administered 2015-05-13: 2 mg via INTRAMUSCULAR
  Filled 2015-05-13: qty 2

## 2015-05-13 NOTE — ED Notes (Signed)
Pt c/o right knee pain that occurred while wrestling earlier this evening, does not remember an injury but the pain has progressively gotten worse, slight swelling to knee, not able to put any weight on leg

## 2015-05-13 NOTE — ED Notes (Signed)
Pt verbalizes understanding of d/c instructions and denies any further needs at this time. 

## 2015-05-13 NOTE — ED Provider Notes (Addendum)
CSN: [id number]     Arrival date & time 05/13/15  0132 History   First MD Initiated Contact with Patient 05/13/15 0145     Chief Complaint  Patient presents with  . 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 He was wrestling yesterday evening about 5:30 PM when he did something to his right knee. There was no gross trauma but he has had the gradual onset of pain in the medial aspect of his knee since. The pain is well localized and is severe. The pain is described as sharp. It is worse with palpation or movement. There is some associated swelling.  Past Medical History  Diagnosis Date  . Depression   . Post-traumatic stress disorder   . ADHD (attention deficit hyperactivity disorder)    Past Surgical History  Procedure Laterality Date  . Tonsillectomy     No family history on file. Social History  Substance Use Topics  . Smoking status: Current Every Day Smoker -- 1.50 packs/day for 15 years  . Smokeless tobacco: None  . Alcohol Use: 0.0 oz/week    3-4 Cans of beer per week     Comment: Occasionally drinker-says he is going to stop because it doesn't help    Review of Systems  All other systems reviewed and are negative.   Allergies  Review of patient's allergies indicates no known allergies.  Home Medications   Prior to Admission medications   Medication Sig Start Date End Date Taking? Authorizing Provider  buPROPion (WELLBUTRIN XL) 150 MG 24 hr tablet Take 1 tablet (150 mg total) by mouth daily. Patient not taking: Reported on 05/13/2015 04/24/13   Beau Fanny, FNP  hydrOXYzine (ATARAX/VISTARIL) 25 MG tablet Take 1 tablet (25 mg total) by mouth every 6 (six) hours as needed for anxiety. Patient not taking: Reported on 05/13/2015 04/24/13   Beau Fanny, FNP  traZODone (DESYREL) 50 MG tablet Take 1 tablet (50 mg total) by mouth at bedtime and may repeat  dose one time if needed. Patient not taking: Reported on 05/13/2015 04/24/13   Beau Fanny, FNP   BP 134/83 mmHg  Pulse 88  Temp(Src) 98.3 F (36.8 C) (Oral)  Resp 20  Ht  (1.702 m)  Wt 182 lb (82.555 kg)  BMI 28.50 kg/m2  SpO2 98%   Physical Exam  General: Well-developed, well-nourished male in no acute distress; appearance consistent with age of record HENT: normocephalic; atraumatic Eyes: Normal appearance Neck: supple Heart: regular rate and rhythm Lungs: Normal respiratory effort and excursion Abdomen: soft; nondistended Extremities: No deformity; full range of motion except right knee; pulses normal; mild swelling and severe tenderness of the right medial collateral ligament, knee grossly stable Neurologic: Awake, alert and oriented; motor function intact in all extremities and symmetric; no facial droop Skin: Warm and dry Psychiatric: Tearful    ED Course  Procedures (including critical care time)   MDM     Paula Libra, MD 05/13/15 0151  Paula Libra, MD 05/13/15 [id number]

## 2015-08-16 ENCOUNTER — Emergency Department (HOSPITAL_BASED_OUTPATIENT_CLINIC_OR_DEPARTMENT_OTHER)
Admission: EM | Admit: 2015-08-16 | Discharge: 2015-08-17 | Disposition: A | Discharge status: Home / Self Care | Payer: Self-pay | Attending: Emergency Medicine | Admitting: Emergency Medicine

## 2015-08-16 ENCOUNTER — Encounter (HOSPITAL_BASED_OUTPATIENT_CLINIC_OR_DEPARTMENT_OTHER): Payer: Self-pay | Admitting: *Deleted

## 2015-08-16 DIAGNOSIS — F17200 Nicotine dependence, unspecified, uncomplicated: Secondary | ICD-10-CM | POA: Insufficient documentation

## 2015-08-16 DIAGNOSIS — F909 Attention-deficit hyperactivity disorder, unspecified type: Secondary | ICD-10-CM | POA: Insufficient documentation

## 2015-08-16 DIAGNOSIS — W458XXA Other foreign body or object entering through skin, initial encounter: Secondary | ICD-10-CM | POA: Insufficient documentation

## 2015-08-16 DIAGNOSIS — F329 Major depressive disorder, single episode, unspecified: Secondary | ICD-10-CM | POA: Insufficient documentation

## 2015-08-16 DIAGNOSIS — L237 Allergic contact dermatitis due to plants, except food: Secondary | ICD-10-CM | POA: Insufficient documentation

## 2015-08-16 DIAGNOSIS — T148XXA Other injury of unspecified body region, initial encounter: Secondary | ICD-10-CM

## 2015-08-16 DIAGNOSIS — Z23 Encounter for immunization: Secondary | ICD-10-CM | POA: Insufficient documentation

## 2015-08-16 MED ORDER — TETANUS-DIPHTH-ACELL PERTUSSIS 5-2.5-18.5 LF-MCG/0.5 IM SUSP
0.5000 mL | Freq: Once | INTRAMUSCULAR | Status: AC
  Administered 2015-08-17: 0.5 mL via INTRAMUSCULAR
  Filled 2015-08-16: qty 0.5

## 2015-08-16 MED ORDER — PREDNISONE 10 MG PO TABS
60.0000 mg | ORAL_TABLET | Freq: Once | ORAL | Status: AC
  Administered 2015-08-16: 60 mg via ORAL
  Filled 2015-08-16: qty 1

## 2015-08-16 MED ORDER — LIDOCAINE-EPINEPHRINE-TETRACAINE (LET) SOLUTION
3.0000 mL | Freq: Once | NASAL | Status: AC
  Administered 2015-08-17: 3 mL via TOPICAL
  Filled 2015-08-16: qty 3

## 2015-08-16 MED ORDER — IBUPROFEN 800 MG PO TABS
800.0000 mg | ORAL_TABLET | Freq: Once | ORAL | Status: AC
  Administered 2015-08-17: 800 mg via ORAL
  Filled 2015-08-16: qty 1

## 2015-08-16 NOTE — ED Notes (Signed)
Pt c/o poison ivy x 3 days to entire body, also c/o foreign object to left lower calf

## 2015-08-16 NOTE — ED Provider Notes (Addendum)
CSN: [id number]     Arrival date & time 08/16/15  2150 History  By signing my name below, I, Frank Cross, attest that this documentation has been prepared under the direction and in the presence of Frank Cross. Electronically Signed: Randell PatientMarrissa Cross, ED Scribe. 08/17/2015. 12:00 AM.   Chief Complaint  Cross presents with  . Poison Ivy   Cross is a 30 y.o. male presenting with rash. The history is provided by the Cross. No language interpreter was used.  Rash Location:  Shoulder/arm and leg Shoulder/arm rash location:  L arm and R arm Leg rash location:  L lower leg and R lower leg Quality: itchiness   Quality: not weeping   Severity:  Moderate Onset quality:  Gradual Timing:  Constant Progression:  Spreading Chronicity:  New Context: plant contact   Context: not animal contact   Relieved by:  Nothing Worsened by:  Nothing tried Ineffective treatments:  OTC analgesics Associated symptoms: no abdominal pain and no hoarse voice    HPI Comments: Frank Cross is a 30 y.o. male who presents to the Emergency Department complaining of constant, mildly pruritic, diffuse rash to his entire body onset 3 days ago. Pt states that he works in [employer] and believes that he came into contact with poison ivy while working. He has applied an OTC topical cream without relief. Denies any other symptoms currently.  Pt also reports a possible foreign body in his lower left anterior leg.  He is unsure when this injury occurred but believes that he may have got a splinter in his leg. He reports pain to the same area.  Past Medical History  Diagnosis Date  . Depression   . Post-traumatic stress disorder   . ADHD (attention deficit hyperactivity disorder)    Past Surgical History  Procedure Laterality Date  . Tonsillectomy     History reviewed. No pertinent family history. Social History  Substance Use Topics  . Smoking status: Current Every Day Smoker -- 2.00 packs/day  for 15 years  . Smokeless tobacco: None  . Alcohol Use: 0.0 oz/week    3-4 Cans of beer per week     Comment: Occasionally drinker-says he is going to stop because it doesn't help    Review of Systems  HENT: Negative for congestion, drooling, facial swelling and hoarse voice.   Gastrointestinal: Negative for abdominal pain.  Skin: Positive for rash and wound.  All other systems reviewed and are negative.   Allergies  Review of Cross's allergies indicates no known allergies.  Home Medications   Prior to Admission medications   Not on File   BP 130/77 mmHg  Pulse 79  Temp(Src) 98.8 F (37.1 C)  Resp 16  Ht 5\' 7"  (1.702 m)  Wt 195 lb (88.451 kg)  BMI 30.53 kg/m2  SpO2 98% Physical Exam  Constitutional: He is oriented to person, place, and time. He appears well-developed and well-nourished. No distress.  HENT:  Head: Normocephalic and atraumatic.  Mouth/Throat: Oropharynx is clear and moist and mucous membranes are normal. No oropharyngeal exudate.  Moist mucus membranes. No swelling of lips, tongue, and uvula.  Eyes: Conjunctivae and EOM are normal. Pupils are equal, round, and reactive to light.  Neck: Normal range of motion. Neck supple.  Cardiovascular: Normal rate, regular rhythm and intact distal pulses.   Pulmonary/Chest: Effort normal and breath sounds normal. No stridor. No respiratory distress. He has no wheezes. He has no rales.  Lungs CTA bilaterally.  Abdominal: Soft. Bowel sounds are  normal. There is no tenderness. There is no rebound and no guarding.  Musculoskeletal: Normal range of motion.  Scab of the left medial shin.  No palpable FB.  On [place] of left shin. Nothing in the vessels. No foreign bodies visualized.   Lymphadenopathy:    He has no cervical adenopathy.  Neurological: He is alert and oriented to person, place, and time.  Skin: Skin is warm and dry. Lesion and rash noted.  Crusted lesions in right antecubital fossa and scattered on the BLE.  None on the left arm, back, or face.  Psychiatric: He has a normal mood and affect. His behavior is normal.  Nursing note and vitals reviewed.   ED Course  Procedures   DIAGNOSTIC STUDIES: Oxygen Saturation is 98% on RA, normal by my interpretation.    COORDINATION OF CARE: 11:57 PM Advised pt to wash all bedding and clothing. Will update tetanus and prescribe antibiotics. Advised at home wound care. Will provide pt with referral to a surgical specialist for follow-up as needed. Will discharge home. Discussed treatment plan with pt at bedside and pt agreed to plan.   Labs Review Labs Reviewed - No data to display  Imaging Review No results found. I have personally reviewed and evaluated these images and lab results as part of my medical decision-making.   EKG Interpretation None      MDM   Final diagnoses:  None   Filed Vitals:   08/16/15 2158  BP: 130/77  Pulse: 79  Temp: 98.8 F (37.1 C)  Resp: 16   Medications  predniSONE (DELTASONE) tablet 60 mg (60 mg Oral Given 08/16/15 2353)  lidocaine-EPINEPHrine-tetracaine (LET) solution (3 mLs Topical Given 08/17/15 0001)  ibuprofen (ADVIL,MOTRIN) tablet 800 mg (800 mg Oral Given 08/17/15 0001)  Tdap (BOOSTRIX) injection 0.5 mL (0.5 mLs Intramuscular Given 08/17/15 0001)   No foreign body visualized nor palpable.  Cross also never saw FB into leg. Will perform wound care, update tetanus. Will discharge home on steroids for poison oak.  Neosporin BID to scab and PRN follow up with surgery for continued FB symptoms.  Strict return   I personally performed the services described in this documentation, which was scribed in my presence. The recorded information has been reviewed and is accurate.      Frank Blamer, Cross 08/17/15 0044  Frank Baratta, Cross 08/17/15 [phone]

## 2015-08-17 ENCOUNTER — Encounter (HOSPITAL_BASED_OUTPATIENT_CLINIC_OR_DEPARTMENT_OTHER): Payer: Self-pay | Admitting: Emergency Medicine

## 2015-08-17 MED ORDER — PREDNISONE 20 MG PO TABS: ORAL_TABLET | ORAL | Status: DC

## 2015-08-17 NOTE — Discharge Instructions (Signed)
Contact Dermatitis Dermatitis is redness, soreness, and swelling (inflammation) of the skin. Contact dermatitis is a reaction to certain substances that touch the skin. There are two types of contact dermatitis:   Irritant contact dermatitis. This type is caused by something that irritates your skin, such as dry hands from washing them too much. This type does not require previous exposure to the substance for a reaction to occur. This type is more common.  Allergic contact dermatitis. This type is caused by a substance that you are allergic to, such as a nickel allergy or poison ivy. This type only occurs if you have been exposed to the substance (allergen) before. Upon a repeat exposure, your body reacts to the substance. This type is less common. CAUSES  Many different substances can cause contact dermatitis. Irritant contact dermatitis is most commonly caused by exposure to:   Makeup.   Soaps.   Detergents.   Bleaches.   Acids.   Metal salts, such as nickel.  Allergic contact dermatitis is most commonly caused by exposure to:   Poisonous plants.   Chemicals.   Jewelry.   Latex.   Medicines.   Preservatives in products, such as clothing.  RISK FACTORS This condition is more likely to develop in:   People who have jobs that expose them to irritants or allergens.  People who have certain medical conditions, such as asthma or eczema.  SYMPTOMS  Symptoms of this condition may occur anywhere on your body where the irritant has touched you or is touched by you. Symptoms include:  Dryness or flaking.   Redness.   Cracks.   Itching.   Pain or a burning feeling.   Blisters.  Drainage of small amounts of blood or clear fluid from skin cracks. With allergic contact dermatitis, there may also be swelling in areas such as the eyelids, mouth, or genitals.  DIAGNOSIS  This condition is diagnosed with a medical history and physical exam. A patch skin test  may be performed to help determine the cause. If the condition is related to your job, you may need to see an occupational medicine specialist. TREATMENT Treatment for this condition includes figuring out what caused the reaction and protecting your skin from further contact. Treatment may also include:   Steroid creams or ointments. Oral steroid medicines may be needed in more severe cases.  Antibiotics or antibacterial ointments, if a skin infection is present.  Antihistamine lotion or an antihistamine taken by mouth to ease itching.  A bandage (dressing). HOME CARE INSTRUCTIONS Skin Care  Moisturize your skin as needed.   Apply cool compresses to the affected areas.  Try taking a bath with:  Epsom salts. Follow the instructions on the packaging. You can get these at your local pharmacy or grocery store.  Baking soda. Pour a small amount into the bath as directed by your health care provider.  Colloidal oatmeal. Follow the instructions on the packaging. You can get this at your local pharmacy or grocery store.  Try applying baking soda paste to your skin. Stir water into baking soda until it reaches a paste-like consistency.  Do not scratch your skin.  Bathe less frequently, such as every other day.  Bathe in lukewarm water. Avoid using hot water. Medicines  Take or apply over-the-counter and prescription medicines only as told by your health care provider.   If you were prescribed an antibiotic medicine, take or apply your antibiotic as told by your health care provider. Do not stop using the   antibiotic even if your condition starts to improve. General Instructions  Keep all follow-up visits as told by your health care provider. This is important.  Avoid the substance that caused your reaction. If you do not know what caused it, keep a journal to try to track what caused it. Write down:  What you eat.  What cosmetic products you use.  What you drink.  What  you wear in the affected area. This includes jewelry.  If you were given a dressing, take care of it as told by your health care provider. This includes when to change and remove it. SEEK MEDICAL CARE IF:   Your condition does not improve with treatment.  Your condition gets worse.  You have signs of infection such as swelling, tenderness, redness, soreness, or warmth in the affected area.  You have a fever.  You have new symptoms. SEEK IMMEDIATE MEDICAL CARE IF:   You have a severe headache, neck pain, or neck stiffness.  You vomit.  You feel very sleepy.  You notice red streaks coming from the affected area.  Your bone or joint underneath the affected area becomes painful after the skin has healed.  The affected area turns darker.  You have difficulty breathing.   This information is not intended to replace advice given to you by your health care provider. Make sure you discuss any questions you have with your health care provider.   Document Released: 03/17/2000 Document Revised: 12/09/2014 Document Reviewed: 08/05/2014 [employer] Interactive Patient Education 2016 [employer]  

## 2016-01-02 ENCOUNTER — Emergency Department (HOSPITAL_BASED_OUTPATIENT_CLINIC_OR_DEPARTMENT_OTHER): Payer: Self-pay

## 2016-01-02 ENCOUNTER — Encounter (HOSPITAL_BASED_OUTPATIENT_CLINIC_OR_DEPARTMENT_OTHER): Payer: Self-pay | Admitting: *Deleted

## 2016-01-02 ENCOUNTER — Emergency Department (HOSPITAL_BASED_OUTPATIENT_CLINIC_OR_DEPARTMENT_OTHER)
Admission: EM | Admit: 2016-01-02 | Discharge: 2016-01-03 | Disposition: A | Discharge status: Home / Self Care | Payer: Self-pay | Attending: Emergency Medicine | Admitting: Emergency Medicine

## 2016-01-02 DIAGNOSIS — S12501A Unspecified nondisplaced fracture of sixth cervical vertebra, initial encounter for closed fracture: Secondary | ICD-10-CM

## 2016-01-02 DIAGNOSIS — S40022A Contusion of left upper arm, initial encounter: Secondary | ICD-10-CM

## 2016-01-02 DIAGNOSIS — S0280XA Fracture of other specified skull and facial bones, unspecified side, initial encounter for closed fracture: Secondary | ICD-10-CM | POA: Insufficient documentation

## 2016-01-02 DIAGNOSIS — F909 Attention-deficit hyperactivity disorder, unspecified type: Secondary | ICD-10-CM | POA: Insufficient documentation

## 2016-01-02 DIAGNOSIS — S022XXA Fracture of nasal bones, initial encounter for closed fracture: Secondary | ICD-10-CM

## 2016-01-02 DIAGNOSIS — Y9289 Other specified places as the place of occurrence of the external cause: Secondary | ICD-10-CM | POA: Insufficient documentation

## 2016-01-02 DIAGNOSIS — S7002XA Contusion of left hip, initial encounter: Secondary | ICD-10-CM

## 2016-01-02 DIAGNOSIS — Y939 Activity, unspecified: Secondary | ICD-10-CM | POA: Insufficient documentation

## 2016-01-02 DIAGNOSIS — S0285XA Fracture of orbit, unspecified, initial encounter for closed fracture: Secondary | ICD-10-CM

## 2016-01-02 DIAGNOSIS — Y999 Unspecified external cause status: Secondary | ICD-10-CM | POA: Insufficient documentation

## 2016-01-02 DIAGNOSIS — F17200 Nicotine dependence, unspecified, uncomplicated: Secondary | ICD-10-CM | POA: Insufficient documentation

## 2016-01-02 DIAGNOSIS — S20219A Contusion of unspecified front wall of thorax, initial encounter: Secondary | ICD-10-CM

## 2016-01-02 DIAGNOSIS — H1131 Conjunctival hemorrhage, right eye: Secondary | ICD-10-CM

## 2016-01-02 DIAGNOSIS — H7292 Unspecified perforation of tympanic membrane, left ear: Secondary | ICD-10-CM

## 2016-01-02 DIAGNOSIS — S60222A Contusion of left hand, initial encounter: Secondary | ICD-10-CM | POA: Insufficient documentation
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
## 2016-01-03 MED ORDER — OXYCODONE-ACETAMINOPHEN 5-325 MG PO TABS: 1.0000 | ORAL_TABLET | Freq: Four times a day (QID) | ORAL | 0 refills | Status: DC | PRN

## 2016-01-03 MED ORDER — METHOCARBAMOL 500 MG PO TABS: 500.0000 mg | ORAL_TABLET | Freq: Four times a day (QID) | ORAL | 0 refills | Status: DC

## 2016-01-03 MED ORDER — OXYCODONE-ACETAMINOPHEN 5-325 MG PO TABS
1.0000 | ORAL_TABLET | Freq: Once | ORAL | Status: AC
  Administered 2016-01-03: 1 via ORAL
  Filled 2016-01-03: qty 1

## 2016-01-03 NOTE — ED Notes (Signed)
Pt given d/c instructions as per chart. Verbalizes understanding. No questions. Rx x 2 with precautions. 

## 2016-01-03 NOTE — Discharge Instructions (Signed)
Please read and follow all provided instructions.  Your diagnoses today include:  1. Closed nondisplaced fracture of sixth cervical vertebra, unspecified fracture morphology, initial encounter (HCC)   2. Closed fracture of nasal bone, initial encounter   3. Closed fracture of orbit, initial encounter (HCC)   4. Subconjunctival hemorrhage of right eye   5. Perforation of left tympanic membrane   6. Contusion of left upper extremity, initial encounter   7. Contusion of rib, unspecified laterality, initial encounter   8. Contusion of left hip, initial encounter     Tests performed today include:  CT scan of your head, face -- showing nasal bone fracture and orbital fracture which should be followed up by Dr. Pollyann Kennedyosen  CT scan of your neck - shows cervical fracture which should be followed up by Dr. Conchita ParisNundkumar  X-rays of chest and extremities - no broken bones  Vital signs. See below for your results today.   Medications prescribed:   Percocet (oxycodone/acetaminophen) - narcotic pain medication  DO NOT drive or perform any activities that require you to be awake and alert because this medicine can make you drowsy. BE VERY CAREFUL not to take multiple medicines containing Tylenol (also called acetaminophen). Doing so can lead to an overdose which can damage your liver and cause liver failure and possibly death.   Robaxin (methocarbamol) - muscle relaxer medication  DO NOT drive or perform any activities that require you to be awake and alert because this medicine can make you drowsy.   Take any prescribed medications only as directed.  Home care instructions:  Follow any educational materials contained in this packet.  Do not blow your nose.   BE VERY CAREFUL not to take multiple medicines containing Tylenol (also called acetaminophen). Doing so can lead to an overdose which can damage your liver and cause liver failure and possibly death.   Follow-up instructions: Please  follow-up with the referrals listed in the next one week for further evaluation of your symptoms.   Return instructions:  SEEK IMMEDIATE MEDICAL ATTENTION IF:  There is confusion or drowsiness (although children frequently become drowsy after injury).   You cannot awaken the injured person.   You have more than one episode of vomiting.   You notice dizziness or unsteadiness which is getting worse, or inability to walk.   You have convulsions or unconsciousness.   You experience severe, persistent headaches not relieved by Tylenol.  You cannot use arms or legs normally.   There are changes in pupil sizes. (This is the black center in the colored part of the eye)   There is clear or bloody discharge from the nose or ears.   You have change in speech, vision, swallowing, or understanding.   Localized weakness, numbness, tingling, or change in bowel or bladder control.  You have any other emergent concerns.  Additional Information: You have had a head injury which does not appear to require admission at this time.  Your vital signs today were: BP 125/81 (BP Location: Right Arm)    Pulse 107    Temp 98.1 F (36.7 C) (Oral)    Resp 18    Ht 5\' 7"  (1.702 m)    Wt 86.2 kg    SpO2 98%    BMI 29.76 kg/m  If your blood pressure (BP) was elevated above 135/85 this visit, please have this repeated by your doctor within one month. --------------

## 2016-01-13 ENCOUNTER — Inpatient Hospital Stay: Payer: Self-pay

## 2016-06-12 ENCOUNTER — Emergency Department (HOSPITAL_BASED_OUTPATIENT_CLINIC_OR_DEPARTMENT_OTHER)
Admission: EM | Admit: 2016-06-12 | Discharge: 2016-06-12 | Disposition: A | Discharge status: Home / Self Care | Payer: BLUE CROSS/BLUE SHIELD | Attending: Emergency Medicine | Admitting: Emergency Medicine

## 2016-06-12 ENCOUNTER — Encounter (HOSPITAL_BASED_OUTPATIENT_CLINIC_OR_DEPARTMENT_OTHER): Payer: Self-pay | Admitting: *Deleted

## 2016-06-12 ENCOUNTER — Emergency Department (HOSPITAL_BASED_OUTPATIENT_CLINIC_OR_DEPARTMENT_OTHER): Payer: BLUE CROSS/BLUE SHIELD

## 2016-06-12 DIAGNOSIS — Y999 Unspecified external cause status: Secondary | ICD-10-CM | POA: Insufficient documentation

## 2016-06-12 DIAGNOSIS — W01198A Fall on same level from slipping, tripping and stumbling with subsequent striking against other object, initial encounter: Secondary | ICD-10-CM | POA: Insufficient documentation

## 2016-06-12 DIAGNOSIS — Y939 Activity, unspecified: Secondary | ICD-10-CM | POA: Insufficient documentation

## 2016-06-12 DIAGNOSIS — Y929 Unspecified place or not applicable: Secondary | ICD-10-CM | POA: Insufficient documentation

## 2016-06-12 DIAGNOSIS — F909 Attention-deficit hyperactivity disorder, unspecified type: Secondary | ICD-10-CM | POA: Insufficient documentation

## 2016-06-12 DIAGNOSIS — S1093XA Contusion of unspecified part of neck, initial encounter: Secondary | ICD-10-CM | POA: Insufficient documentation

## 2016-06-12 DIAGNOSIS — F17200 Nicotine dependence, unspecified, uncomplicated: Secondary | ICD-10-CM | POA: Insufficient documentation

## 2016-06-12 MED ORDER — METHOCARBAMOL 500 MG PO TABS: 500.0000 mg | ORAL_TABLET | Freq: Four times a day (QID) | ORAL | 0 refills | Status: DC | PRN

## 2016-06-12 MED ORDER — IBUPROFEN 800 MG PO TABS: 800.0000 mg | ORAL_TABLET | Freq: Three times a day (TID) | ORAL | 0 refills | Status: DC

## 2016-06-12 MED ORDER — HYDROCODONE-ACETAMINOPHEN 5-325 MG PO TABS
1.0000 | ORAL_TABLET | Freq: Once | ORAL | Status: AC
  Administered 2016-06-12: 1 via ORAL
  Filled 2016-06-12: qty 1
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
## 2016-06-12 NOTE — ED Notes (Signed)
Patient slipped, striking his neck on wooden steps at his home @2  hours ago. Denies LOC. Patient is A&Ox4, NAD noted. Patient took 2 ibuprofen at the time of the fall. Patient has been able to ambulate without difficulty. Grips S&E, no deficits noted.

## 2016-06-12 NOTE — ED Notes (Signed)
Patient returned from CT

## 2016-06-12 NOTE — ED Triage Notes (Signed)
He slipped on ice and fell hitting the back of his neck on wooden steps 2 hours ago. He has been ambulatory since.

## 2020-03-30 ENCOUNTER — Other Ambulatory Visit: Payer: Self-pay

## 2020-03-30 ENCOUNTER — Encounter (HOSPITAL_BASED_OUTPATIENT_CLINIC_OR_DEPARTMENT_OTHER): Payer: Self-pay | Admitting: Emergency Medicine

## 2020-03-30 DIAGNOSIS — K0889 Other specified disorders of teeth and supporting structures: Secondary | ICD-10-CM | POA: Diagnosis not present

## 2020-03-30 DIAGNOSIS — Z5321 Procedure and treatment not carried out due to patient leaving prior to being seen by health care provider: Secondary | ICD-10-CM | POA: Diagnosis not present
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
## 2020-03-31 ENCOUNTER — Emergency Department (HOSPITAL_BASED_OUTPATIENT_CLINIC_OR_DEPARTMENT_OTHER)
Admission: EM | Admit: 2020-03-31 | Discharge: 2020-03-31 | Disposition: A | Discharge status: Home / Self Care | Payer: Medicaid Other | Attending: Emergency Medicine | Admitting: Emergency Medicine

## 2020-03-31 NOTE — ED Notes (Signed)
Called to take to treatment room  No response from lobby 

## 2020-12-04 ENCOUNTER — Observation Stay (HOSPITAL_COMMUNITY): Payer: Medicaid Other | Admitting: Certified Registered Nurse Anesthetist

## 2020-12-04 ENCOUNTER — Observation Stay (HOSPITAL_COMMUNITY): Payer: Medicaid Other

## 2020-12-04 ENCOUNTER — Inpatient Hospital Stay (HOSPITAL_COMMUNITY)
Admission: EM | Admit: 2020-12-04 | Discharge: 2020-12-06 | DRG: 494 | Disposition: A | Discharge status: Home / Self Care | Payer: Medicaid Other | Attending: Orthopaedic Surgery | Admitting: Orthopaedic Surgery

## 2020-12-04 ENCOUNTER — Emergency Department (HOSPITAL_COMMUNITY): Payer: Medicaid Other

## 2020-12-04 ENCOUNTER — Other Ambulatory Visit: Payer: Self-pay

## 2020-12-04 ENCOUNTER — Encounter (HOSPITAL_COMMUNITY): Payer: Self-pay | Admitting: *Deleted

## 2020-12-04 ENCOUNTER — Encounter (HOSPITAL_COMMUNITY)
Admission: EM | Disposition: A | Discharge status: Home / Self Care | Payer: Self-pay | Source: Home / Self Care | Attending: Orthopaedic Surgery

## 2020-12-04 DIAGNOSIS — S301XXA Contusion of abdominal wall, initial encounter: Secondary | ICD-10-CM

## 2020-12-04 DIAGNOSIS — S82202C Unspecified fracture of shaft of left tibia, initial encounter for open fracture type IIIA, IIIB, or IIIC: Secondary | ICD-10-CM

## 2020-12-04 DIAGNOSIS — S83512A Sprain of anterior cruciate ligament of left knee, initial encounter: Secondary | ICD-10-CM | POA: Diagnosis present

## 2020-12-04 DIAGNOSIS — R52 Pain, unspecified: Secondary | ICD-10-CM

## 2020-12-04 DIAGNOSIS — Z09 Encounter for follow-up examination after completed treatment for conditions other than malignant neoplasm: Secondary | ICD-10-CM

## 2020-12-04 DIAGNOSIS — Z20822 Contact with and (suspected) exposure to covid-19: Secondary | ICD-10-CM | POA: Diagnosis present

## 2020-12-04 DIAGNOSIS — S82252C Displaced comminuted fracture of shaft of left tibia, initial encounter for open fracture type IIIA, IIIB, or IIIC: Secondary | ICD-10-CM | POA: Diagnosis not present

## 2020-12-04 DIAGNOSIS — M25562 Pain in left knee: Secondary | ICD-10-CM

## 2020-12-04 DIAGNOSIS — S83522A Sprain of posterior cruciate ligament of left knee, initial encounter: Secondary | ICD-10-CM | POA: Diagnosis present

## 2020-12-04 DIAGNOSIS — F17210 Nicotine dependence, cigarettes, uncomplicated: Secondary | ICD-10-CM | POA: Diagnosis present

## 2020-12-04 DIAGNOSIS — S82202B Unspecified fracture of shaft of left tibia, initial encounter for open fracture type I or II: Secondary | ICD-10-CM | POA: Diagnosis present

## 2020-12-04 DIAGNOSIS — S20219A Contusion of unspecified front wall of thorax, initial encounter: Secondary | ICD-10-CM

## 2020-12-04 DIAGNOSIS — S83422A Sprain of lateral collateral ligament of left knee, initial encounter: Secondary | ICD-10-CM | POA: Diagnosis present

## 2020-12-04 DIAGNOSIS — S0101XA Laceration without foreign body of scalp, initial encounter: Secondary | ICD-10-CM | POA: Diagnosis present

## 2020-12-04 DIAGNOSIS — S30811A Abrasion of abdominal wall, initial encounter: Secondary | ICD-10-CM | POA: Diagnosis present

## 2020-12-04 HISTORY — PX: TIBIA IM NAIL INSERTION: SHX2516

## 2020-12-04 HISTORY — PX: APPLICATION OF WOUND VAC: SHX5189

## 2020-12-04 LAB — CBC WITH DIFFERENTIAL/PLATELET
Abs Immature Granulocytes: 0.09 10*3/uL — ABNORMAL HIGH (ref 0.00–0.07)
Basophils Absolute: 0.1 10*3/uL (ref 0.0–0.1)
Basophils Relative: 0 %
Eosinophils Absolute: 0.4 10*3/uL (ref 0.0–0.5)
Eosinophils Relative: 2 %
HCT: 38 % — ABNORMAL LOW (ref 39.0–52.0)
Hemoglobin: 12.7 g/dL — ABNORMAL LOW (ref 13.0–17.0)
Immature Granulocytes: 0 %
Lymphocytes Relative: 18 %
Lymphs Abs: 4 10*3/uL (ref 0.7–4.0)
MCH: 30.6 pg (ref 26.0–34.0)
MCHC: 33.4 g/dL (ref 30.0–36.0)
MCV: 91.6 fL (ref 80.0–100.0)
Monocytes Absolute: 1.2 10*3/uL — ABNORMAL HIGH (ref 0.1–1.0)
Monocytes Relative: 5 %
Neutro Abs: 16.2 10*3/uL — ABNORMAL HIGH (ref 1.7–7.7)
Neutrophils Relative %: 75 %
Platelets: 331 10*3/uL (ref 150–400)
RBC: 4.15 MIL/uL — ABNORMAL LOW (ref 4.22–5.81)
RDW: 13 % (ref 11.5–15.5)
WBC: 21.9 10*3/uL — ABNORMAL HIGH (ref 4.0–10.5)
nRBC: 0 % (ref 0.0–0.2)

## 2020-12-04 LAB — PROTIME-INR
INR: 1 (ref 0.8–1.2)
Prothrombin Time: 13.5 seconds (ref 11.4–15.2)

## 2020-12-04 LAB — TYPE AND SCREEN
ABO/RH(D): O POS
Antibody Screen: NEGATIVE

## 2020-12-04 LAB — I-STAT CHEM 8, ED
BUN: 15 mg/dL (ref 6–20)
Calcium, Ion: 1.09 mmol/L — ABNORMAL LOW (ref 1.15–1.40)
Chloride: 105 mmol/L (ref 98–111)
Creatinine, Ser: 0.9 mg/dL (ref 0.61–1.24)
Glucose, Bld: 141 mg/dL — ABNORMAL HIGH (ref 70–99)
HCT: 38 % — ABNORMAL LOW (ref 39.0–52.0)
Hemoglobin: 12.9 g/dL — ABNORMAL LOW (ref 13.0–17.0)
Potassium: 3.3 mmol/L — ABNORMAL LOW (ref 3.5–5.1)
Sodium: 139 mmol/L (ref 135–145)
TCO2: 22 mmol/L (ref 22–32)

## 2020-12-04 LAB — CREATININE, SERUM
Creatinine, Ser: 0.87 mg/dL (ref 0.61–1.24)
GFR, Estimated: >60 mL/min (ref >60)

## 2020-12-04 LAB — CBC
HCT: 34.3 % — ABNORMAL LOW (ref 39.0–52.0)
Hemoglobin: 11.6 g/dL — ABNORMAL LOW (ref 13.0–17.0)
MCH: 30.4 pg (ref 26.0–34.0)
MCHC: 33.8 g/dL (ref 30.0–36.0)
MCV: 89.8 fL (ref 80.0–100.0)
Platelets: 243 10*3/uL (ref 150–400)
RBC: 3.82 MIL/uL — ABNORMAL LOW (ref 4.22–5.81)
RDW: 13.2 % (ref 11.5–15.5)
WBC: 11.7 10*3/uL — ABNORMAL HIGH (ref 4.0–10.5)
nRBC: 0 % (ref 0.0–0.2)

## 2020-12-04 LAB — BASIC METABOLIC PANEL
Anion gap: 10 (ref 5–15)
BUN: 14 mg/dL (ref 6–20)
CO2: 22 mmol/L (ref 22–32)
Calcium: 8.7 mg/dL — ABNORMAL LOW (ref 8.9–10.3)
Chloride: 106 mmol/L (ref 98–111)
Creatinine, Ser: 1 mg/dL (ref 0.61–1.24)
GFR, Estimated: >60 mL/min (ref >60)
Glucose, Bld: 144 mg/dL — ABNORMAL HIGH (ref 70–99)
Potassium: 3.3 mmol/L — ABNORMAL LOW (ref 3.5–5.1)
Sodium: 138 mmol/L (ref 135–145)

## 2020-12-04 LAB — SARS CORONAVIRUS 2 (TAT 6-24 HRS): SARS Coronavirus 2: NEGATIVE

## 2020-12-04 LAB — HIV ANTIBODY (ROUTINE TESTING W REFLEX): HIV Screen 4th Generation wRfx: NONREACTIVE

## 2020-12-04 LAB — ETHANOL: Alcohol, Ethyl (B): <10 mg/dL (ref <10)

## 2020-12-04 LAB — ABO/RH: ABO/RH(D): O POS

## 2020-12-04 SURGERY — INSERTION, INTRAMEDULLARY ROD, TIBIA
Anesthesia: General | Laterality: Left

## 2020-12-04 MED ORDER — ROCURONIUM BROMIDE 10 MG/ML (PF) SYRINGE
PREFILLED_SYRINGE | INTRAVENOUS | Status: DC | PRN
  Administered 2020-12-04: 40 mg via INTRAVENOUS

## 2020-12-04 MED ORDER — ONDANSETRON HCL 4 MG/2ML IJ SOLN: 4.0000 mg | Freq: Four times a day (QID) | INTRAMUSCULAR | Status: DC | PRN

## 2020-12-04 MED ORDER — POLYETHYLENE GLYCOL 3350 17 G PO PACK
17.0000 g | PACK | Freq: Every day | ORAL | Status: DC | PRN
  Filled 2020-12-04: qty 1

## 2020-12-04 MED ORDER — SUCCINYLCHOLINE CHLORIDE 200 MG/10ML IV SOSY
PREFILLED_SYRINGE | INTRAVENOUS | Status: DC | PRN
  Administered 2020-12-04: 120 mg via INTRAVENOUS

## 2020-12-04 MED ORDER — DEXAMETHASONE SODIUM PHOSPHATE 10 MG/ML IJ SOLN
INTRAMUSCULAR | Status: DC | PRN
Start: 2020-12-04 — End: 2020-12-04
  Administered 2020-12-04: 10 mg via INTRAVENOUS

## 2020-12-04 MED ORDER — CELECOXIB 200 MG PO CAPS
200.0000 mg | ORAL_CAPSULE | Freq: Two times a day (BID) | ORAL | Status: DC
  Administered 2020-12-04: 200 mg via ORAL
  Filled 2020-12-04 (×2): qty 1

## 2020-12-04 MED ORDER — SENNOSIDES-DOCUSATE SODIUM 8.6-50 MG PO TABS: 1.0000 | ORAL_TABLET | Freq: Every evening | ORAL | Status: DC | PRN

## 2020-12-04 MED ORDER — TOBRAMYCIN SULFATE 1.2 G IJ SOLR
INTRAMUSCULAR | Status: DC | PRN
  Administered 2020-12-04: 1.2 g via TOPICAL

## 2020-12-04 MED ORDER — LACTATED RINGERS IV SOLN: INTRAVENOUS | Status: DC

## 2020-12-04 MED ORDER — ACETAMINOPHEN 500 MG PO TABS
1000.0000 mg | ORAL_TABLET | Freq: Four times a day (QID) | ORAL | Status: DC
  Administered 2020-12-04 – 2020-12-06 (×8): 1000 mg via ORAL
  Filled 2020-12-04 (×7): qty 2

## 2020-12-04 MED ORDER — METOCLOPRAMIDE HCL 5 MG PO TABS: 5.0000 mg | ORAL_TABLET | Freq: Three times a day (TID) | ORAL | Status: DC | PRN

## 2020-12-04 MED ORDER — BUPIVACAINE-EPINEPHRINE (PF) 0.25% -1:200000 IJ SOLN
INTRAMUSCULAR | Status: AC
  Filled 2020-12-04: qty 30

## 2020-12-04 MED ORDER — MUPIROCIN 2 % EX OINT
1.0000 | TOPICAL_OINTMENT | Freq: Two times a day (BID) | CUTANEOUS | Status: DC
Start: 2020-12-04 — End: 2020-12-06
  Administered 2020-12-05 – 2020-12-06 (×2): 1 via NASAL
  Filled 2020-12-04 (×2): qty 22

## 2020-12-04 MED ORDER — HYDROMORPHONE HCL 1 MG/ML IJ SOLN
2.0000 mg | Freq: Once | INTRAMUSCULAR | Status: AC
  Administered 2020-12-04: 2 mg via INTRAVENOUS
  Filled 2020-12-04: qty 2

## 2020-12-04 MED ORDER — VANCOMYCIN HCL 1000 MG IV SOLR
INTRAVENOUS | Status: DC | PRN
  Administered 2020-12-04: 1000 mg via TOPICAL

## 2020-12-04 MED ORDER — OXYCODONE HCL 5 MG PO TABS
10.0000 mg | ORAL_TABLET | ORAL | Status: DC | PRN
  Administered 2020-12-04 – 2020-12-06 (×5): 15 mg via ORAL
  Filled 2020-12-04 (×5): qty 3

## 2020-12-04 MED ORDER — ZOLPIDEM TARTRATE 5 MG PO TABS
5.0000 mg | ORAL_TABLET | Freq: Every evening | ORAL | Status: DC | PRN
  Filled 2020-12-04: qty 1

## 2020-12-04 MED ORDER — METOCLOPRAMIDE HCL 5 MG/ML IJ SOLN: 5.0000 mg | Freq: Three times a day (TID) | INTRAMUSCULAR | Status: DC | PRN

## 2020-12-04 MED ORDER — VANCOMYCIN HCL 1000 MG IV SOLR
INTRAVENOUS | Status: AC
  Filled 2020-12-04: qty 20

## 2020-12-04 MED ORDER — FENTANYL CITRATE (PF) 250 MCG/5ML IJ SOLN
INTRAMUSCULAR | Status: DC | PRN
  Administered 2020-12-04 (×2): 50 ug via INTRAVENOUS
  Administered 2020-12-04: 25 ug via INTRAVENOUS
  Administered 2020-12-04: 100 ug via INTRAVENOUS
  Administered 2020-12-04: 25 ug via INTRAVENOUS

## 2020-12-04 MED ORDER — OXYCODONE HCL 5 MG PO TABS
5.0000 mg | ORAL_TABLET | ORAL | Status: DC | PRN
  Administered 2020-12-04: 5 mg via ORAL
  Filled 2020-12-04: qty 1

## 2020-12-04 MED ORDER — DOCUSATE SODIUM 100 MG PO CAPS
100.0000 mg | ORAL_CAPSULE | Freq: Two times a day (BID) | ORAL | Status: DC
  Administered 2020-12-04: 100 mg via ORAL
  Filled 2020-12-04: qty 1

## 2020-12-04 MED ORDER — ONDANSETRON HCL 4 MG PO TABS: 4.0000 mg | ORAL_TABLET | Freq: Four times a day (QID) | ORAL | Status: DC | PRN

## 2020-12-04 MED ORDER — HYDROMORPHONE HCL 1 MG/ML IJ SOLN
0.5000 mg | INTRAMUSCULAR | Status: DC | PRN
  Administered 2020-12-04 – 2020-12-05 (×2): 1 mg via INTRAVENOUS
  Filled 2020-12-04: qty 1

## 2020-12-04 MED ORDER — ENOXAPARIN SODIUM 40 MG/0.4ML IJ SOSY
40.0000 mg | PREFILLED_SYRINGE | INTRAMUSCULAR | Status: DC
  Administered 2020-12-05 – 2020-12-06 (×2): 40 mg via SUBCUTANEOUS
  Filled 2020-12-04 (×2): qty 0.4

## 2020-12-04 MED ORDER — ONDANSETRON HCL 4 MG/2ML IJ SOLN
4.0000 mg | Freq: Four times a day (QID) | INTRAMUSCULAR | Status: DC | PRN
Start: 2020-12-04 — End: 2020-12-04

## 2020-12-04 MED ORDER — DIPHENHYDRAMINE HCL 12.5 MG/5ML PO ELIX
12.5000 mg | ORAL_SOLUTION | ORAL | Status: DC | PRN
  Administered 2020-12-05: 25 mg via ORAL
  Filled 2020-12-04: qty 10

## 2020-12-04 MED ORDER — DOCUSATE SODIUM 100 MG PO CAPS
100.0000 mg | ORAL_CAPSULE | Freq: Two times a day (BID) | ORAL | Status: DC
  Administered 2020-12-04 – 2020-12-06 (×4): 100 mg via ORAL
  Filled 2020-12-04 (×4): qty 1

## 2020-12-04 MED ORDER — CEFAZOLIN SODIUM-DEXTROSE 2-4 GM/100ML-% IV SOLN
2.0000 g | Freq: Three times a day (TID) | INTRAVENOUS | Status: DC
  Administered 2020-12-04 (×2): 2 g via INTRAVENOUS
  Filled 2020-12-04 (×3): qty 100

## 2020-12-04 MED ORDER — LIDOCAINE 2% (20 MG/ML) 5 ML SYRINGE
INTRAMUSCULAR | Status: DC | PRN
  Administered 2020-12-04: 40 mg via INTRAVENOUS

## 2020-12-04 MED ORDER — HYDROMORPHONE HCL 1 MG/ML IJ SOLN
2.0000 mg | Freq: Once | INTRAMUSCULAR | Status: AC
Start: 2020-12-04 — End: 2020-12-04
  Administered 2020-12-04: 2 mg via INTRAVENOUS
  Filled 2020-12-04: qty 2

## 2020-12-04 MED ORDER — ACETAMINOPHEN 10 MG/ML IV SOLN
INTRAVENOUS | Status: AC
  Filled 2020-12-04: qty 100

## 2020-12-04 MED ORDER — HYDROMORPHONE HCL 1 MG/ML IJ SOLN
INTRAMUSCULAR | Status: AC
  Administered 2020-12-04: 1 mg via INTRAVENOUS
  Filled 2020-12-04: qty 1

## 2020-12-04 MED ORDER — CHLORHEXIDINE GLUCONATE 4 % EX LIQD: 60.0000 mL | Freq: Once | CUTANEOUS | Status: DC

## 2020-12-04 MED ORDER — SODIUM CHLORIDE 0.9 % IR SOLN
Status: DC | PRN
  Administered 2020-12-04: 1000 mL
  Administered 2020-12-04: 3000 mL

## 2020-12-04 MED ORDER — METHOCARBAMOL 1000 MG/10ML IJ SOLN
500.0000 mg | Freq: Four times a day (QID) | INTRAVENOUS | Status: DC | PRN
  Filled 2020-12-04: qty 5

## 2020-12-04 MED ORDER — IOHEXOL 350 MG/ML SOLN
85.0000 mL | Freq: Once | INTRAVENOUS | Status: AC | PRN
  Administered 2020-12-04: 85 mL via INTRAVENOUS

## 2020-12-04 MED ORDER — BISACODYL 5 MG PO TBEC: 5.0000 mg | DELAYED_RELEASE_TABLET | Freq: Every day | ORAL | Status: DC | PRN

## 2020-12-04 MED ORDER — TOBRAMYCIN SULFATE 1.2 G IJ SOLR
INTRAMUSCULAR | Status: AC
  Filled 2020-12-04: qty 1.2

## 2020-12-04 MED ORDER — SUGAMMADEX SODIUM 200 MG/2ML IV SOLN
INTRAVENOUS | Status: DC | PRN
  Administered 2020-12-04: 200 mg via INTRAVENOUS

## 2020-12-04 MED ORDER — DIPHENHYDRAMINE HCL 12.5 MG/5ML PO ELIX: 12.5000 mg | ORAL_SOLUTION | ORAL | Status: DC | PRN

## 2020-12-04 MED ORDER — 0.9 % SODIUM CHLORIDE (POUR BTL) OPTIME
TOPICAL | Status: DC | PRN
  Administered 2020-12-04: 1000 mL

## 2020-12-04 MED ORDER — HYDROMORPHONE HCL 1 MG/ML IJ SOLN: 1.0000 mg | Freq: Once | INTRAMUSCULAR | Status: AC

## 2020-12-04 MED ORDER — HYDROMORPHONE HCL 1 MG/ML IJ SOLN
0.5000 mg | INTRAMUSCULAR | Status: DC | PRN
  Administered 2020-12-04 (×2): 1 mg via INTRAVENOUS
  Filled 2020-12-04 (×3): qty 1

## 2020-12-04 MED ORDER — ZOLPIDEM TARTRATE 5 MG PO TABS: 5.0000 mg | ORAL_TABLET | Freq: Every evening | ORAL | Status: DC | PRN

## 2020-12-04 MED ORDER — ACETAMINOPHEN 500 MG PO TABS
1000.0000 mg | ORAL_TABLET | Freq: Four times a day (QID) | ORAL | Status: DC
  Administered 2020-12-04 (×2): 1000 mg via ORAL
  Filled 2020-12-04 (×2): qty 2

## 2020-12-04 MED ORDER — METHOCARBAMOL 500 MG PO TABS
500.0000 mg | ORAL_TABLET | Freq: Four times a day (QID) | ORAL | Status: DC | PRN
  Administered 2020-12-04 – 2020-12-06 (×4): 500 mg via ORAL
  Filled 2020-12-04 (×4): qty 1

## 2020-12-04 MED ORDER — MIDAZOLAM HCL 2 MG/2ML IJ SOLN
INTRAMUSCULAR | Status: AC
  Filled 2020-12-04: qty 2

## 2020-12-04 MED ORDER — ONDANSETRON HCL 4 MG/2ML IJ SOLN
INTRAMUSCULAR | Status: DC | PRN
  Administered 2020-12-04: 4 mg via INTRAVENOUS

## 2020-12-04 MED ORDER — DEXMEDETOMIDINE (PRECEDEX) IN NS 20 MCG/5ML (4 MCG/ML) IV SYRINGE
PREFILLED_SYRINGE | INTRAVENOUS | Status: DC | PRN
  Administered 2020-12-04 (×5): 4 ug via INTRAVENOUS

## 2020-12-04 MED ORDER — ORAL CARE MOUTH RINSE: 15.0000 mL | Freq: Once | OROMUCOSAL | Status: AC

## 2020-12-04 MED ORDER — KETOROLAC TROMETHAMINE 15 MG/ML IJ SOLN
15.0000 mg | Freq: Four times a day (QID) | INTRAMUSCULAR | Status: AC
  Administered 2020-12-04 – 2020-12-05 (×4): 15 mg via INTRAVENOUS
  Filled 2020-12-04 (×3): qty 1

## 2020-12-04 MED ORDER — ACETAMINOPHEN 10 MG/ML IV SOLN
INTRAVENOUS | Status: DC | PRN
  Administered 2020-12-04: 1000 mg via INTRAVENOUS

## 2020-12-04 MED ORDER — CEFAZOLIN SODIUM-DEXTROSE 2-4 GM/100ML-% IV SOLN: 2.0000 g | INTRAVENOUS | Status: DC

## 2020-12-04 MED ORDER — PROPOFOL 10 MG/ML IV BOLUS
INTRAVENOUS | Status: DC | PRN
  Administered 2020-12-04: 150 mg via INTRAVENOUS

## 2020-12-04 MED ORDER — METHOCARBAMOL 500 MG PO TABS
500.0000 mg | ORAL_TABLET | Freq: Four times a day (QID) | ORAL | Status: DC | PRN
  Filled 2020-12-04: qty 1

## 2020-12-04 MED ORDER — MIDAZOLAM HCL 2 MG/2ML IJ SOLN
INTRAMUSCULAR | Status: DC | PRN
  Administered 2020-12-04: 2 mg via INTRAVENOUS

## 2020-12-04 MED ORDER — CHLORHEXIDINE GLUCONATE 0.12 % MT SOLN
15.0000 mL | Freq: Once | OROMUCOSAL | Status: AC
  Administered 2020-12-04: 15 mL via OROMUCOSAL
  Filled 2020-12-04: qty 15

## 2020-12-04 MED ORDER — METHOCARBAMOL 1000 MG/10ML IJ SOLN: 500.0000 mg | Freq: Four times a day (QID) | INTRAVENOUS | Status: DC | PRN

## 2020-12-04 MED ORDER — FLEET ENEMA 7-19 GM/118ML RE ENEM: 1.0000 | ENEMA | Freq: Once | RECTAL | Status: DC | PRN

## 2020-12-04 MED ORDER — OXYCODONE HCL 5 MG PO TABS
5.0000 mg | ORAL_TABLET | ORAL | Status: DC | PRN
  Administered 2020-12-05 – 2020-12-06 (×3): 10 mg via ORAL
  Filled 2020-12-04 (×3): qty 2

## 2020-12-04 MED ORDER — POVIDONE-IODINE 10 % EX SWAB: 2.0000 "application " | Freq: Once | CUTANEOUS | Status: DC

## 2020-12-04 MED ORDER — CEFAZOLIN SODIUM-DEXTROSE 2-4 GM/100ML-% IV SOLN
2.0000 g | Freq: Three times a day (TID) | INTRAVENOUS | Status: AC
  Administered 2020-12-05 (×3): 2 g via INTRAVENOUS
  Filled 2020-12-04 (×5): qty 100

## 2020-12-04 MED ORDER — FENTANYL CITRATE (PF) 250 MCG/5ML IJ SOLN
INTRAMUSCULAR | Status: AC
  Filled 2020-12-04: qty 5

## 2020-12-04 SURGICAL SUPPLY — 65 items
BAG COUNTER SPONGE SURGICOUNT (BAG) IMPLANT
BIT DRILL 4.0X165 AO STYLE (BIT) ×2 IMPLANT
BIT DRILL 4.0X280 (BIT) ×2 IMPLANT
BNDG COHESIVE 4X5 TAN STRL (GAUZE/BANDAGES/DRESSINGS) ×2 IMPLANT
BNDG ELASTIC 6X5.8 VLCR STR LF (GAUZE/BANDAGES/DRESSINGS) ×4 IMPLANT
BNDG GAUZE ELAST 4 BULKY (GAUZE/BANDAGES/DRESSINGS) ×4 IMPLANT
CANISTER SUCT 3000ML PPV (MISCELLANEOUS) ×2 IMPLANT
CANISTER WOUNDNEG PRESSURE 500 (CANNISTER) ×2 IMPLANT
CHLORAPREP W/TINT 26 (MISCELLANEOUS) ×2 IMPLANT
COVER MAYO STAND STRL (DRAPES) ×2 IMPLANT
COVER SURGICAL LIGHT HANDLE (MISCELLANEOUS) ×2 IMPLANT
DRAPE C-ARM 42X72 X-RAY (DRAPES) ×2 IMPLANT
DRAPE C-ARMOR (DRAPES) ×2 IMPLANT
DRAPE IMP U-DRAPE 54X76 (DRAPES) ×2 IMPLANT
DRAPE U-SHAPE 47X51 STRL (DRAPES) ×2 IMPLANT
DRESSING PEEL AND PLC PRVNA 13 (GAUZE/BANDAGES/DRESSINGS) ×1 IMPLANT
DRSG AQUACEL AG ADV 3.5X 6 (GAUZE/BANDAGES/DRESSINGS) ×2 IMPLANT
DRSG PAD ABDOMINAL 8X10 ST (GAUZE/BANDAGES/DRESSINGS) ×4 IMPLANT
DRSG PEEL AND PLACE PREVENA 13 (GAUZE/BANDAGES/DRESSINGS) ×2
ELECT CAUTERY BLADE 6.4 (BLADE) ×2 IMPLANT
ELECT REM PT RETURN 9FT ADLT (ELECTROSURGICAL) ×2
ELECTRODE REM PT RTRN 9FT ADLT (ELECTROSURGICAL) ×1 IMPLANT
GAUZE SPONGE 4X4 12PLY STRL (GAUZE/BANDAGES/DRESSINGS) ×2 IMPLANT
GAUZE XEROFORM 1X8 LF (GAUZE/BANDAGES/DRESSINGS) ×2 IMPLANT
GLOVE SRG 8 PF TXTR STRL LF DI (GLOVE) ×1 IMPLANT
GLOVE SURG ENC MOIS LTX SZ6.5 (GLOVE) ×2 IMPLANT
GLOVE SURG LTX SZ8 (GLOVE) ×2 IMPLANT
GLOVE SURG UNDER LTX SZ6.5 (GLOVE) ×2 IMPLANT
GLOVE SURG UNDER POLY LF SZ8 (GLOVE) ×1
GOWN STRL REUS W/ TWL LRG LVL3 (GOWN DISPOSABLE) ×2 IMPLANT
GOWN STRL REUS W/ TWL XL LVL3 (GOWN DISPOSABLE) ×1 IMPLANT
GOWN STRL REUS W/TWL LRG LVL3 (GOWN DISPOSABLE) ×2
GOWN STRL REUS W/TWL XL LVL3 (GOWN DISPOSABLE) ×1
GUIDEWIRE BALL NOSE 3.0X900 (WIRE) ×1
GUIDEWIRE ORTH 900X3XBALL NOSE (WIRE) ×1 IMPLANT
HANDPIECE INTERPULSE COAX TIP (DISPOSABLE) ×1
KIT BASIN OR (CUSTOM PROCEDURE TRAY) ×2 IMPLANT
KIT DRSG PREVENA PLUS 7DAY 125 (MISCELLANEOUS) ×2 IMPLANT
KIT TURNOVER KIT B (KITS) ×2 IMPLANT
NAIL TIBIAL 9X33 DA VINCI (Nail) ×2 IMPLANT
NS IRRIG 1000ML POUR BTL (IV SOLUTION) ×2 IMPLANT
PACK ORTHO EXTREMITY (CUSTOM PROCEDURE TRAY) ×2 IMPLANT
PAD ARMBOARD 7.5X6 YLW CONV (MISCELLANEOUS) ×4 IMPLANT
PIN GUIDE THRD AR 3.2X330 (PIN) ×2 IMPLANT
SCREW CORT CAPT LOCK 5X60 (Screw) ×2 IMPLANT
SCREW CORT LOCK 5.0X42 (Screw) ×2 IMPLANT
SCREW LOCK CORT 5.0X38 (Screw) ×2 IMPLANT
SCREW LOCK FEM 5.0X75 (Screw) ×2 IMPLANT
SET HNDPC FAN SPRY TIP SCT (DISPOSABLE) ×1 IMPLANT
SPONGE T-LAP 18X18 ~~LOC~~+RFID (SPONGE) IMPLANT
STAPLER VISISTAT 35W (STAPLE) IMPLANT
SUT ETHILON 2 0 FS 18 (SUTURE) ×12 IMPLANT
SUT ETHILON 2 LR (SUTURE) ×4 IMPLANT
SUT ETHILON 3 0 PS 1 (SUTURE) ×2 IMPLANT
SUT VIC AB 0 CT1 27 (SUTURE) ×2
SUT VIC AB 0 CT1 27XBRD ANBCTR (SUTURE) ×2 IMPLANT
SUT VIC AB 1 CT1 27 (SUTURE) ×1
SUT VIC AB 1 CT1 27XBRD ANBCTR (SUTURE) ×1 IMPLANT
SUT VIC AB 2-0 CT1 27 (SUTURE) ×1
SUT VIC AB 2-0 CT1 TAPERPNT 27 (SUTURE) ×1 IMPLANT
SUT VIC AB 3-0 CT1 27 (SUTURE) ×2
SUT VIC AB 3-0 CT1 TAPERPNT 27 (SUTURE) ×2 IMPLANT
TUBE CONNECTING 12X1/4 (SUCTIONS) ×2 IMPLANT
WATER STERILE IRR 1000ML POUR (IV SOLUTION) ×2 IMPLANT
YANKAUER SUCT BULB TIP NO VENT (SUCTIONS) ×2 IMPLANT

## 2020-12-04 NOTE — ED Notes (Signed)
Initial portable xrays.  Posterior splint applied by  The ortho tech

## 2020-12-04 NOTE — ED Notes (Signed)
Abrasions to the knuckles of his rt hand cleaned  Lt hand swelled up a little while ago   Portable xray  done
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
## 2020-12-04 NOTE — ED Notes (Signed)
pts wifew remains at  The bedside

## 2020-12-04 NOTE — ED Notes (Signed)
Attempted to call report. RN to look at charge. Will call back in 5 min.

## 2020-12-04 NOTE — Anesthesia Procedure Notes (Signed)
Procedure Name: Intubation Date/Time: 12/04/2020 3:39 PM Performed by: Clearnce Sorrel, CRNA Pre-anesthesia Checklist: Patient identified, Emergency Drugs available, Suction available and Patient being monitored Patient Re-evaluated:Patient Re-evaluated prior to induction Oxygen Delivery Method: Circle System Utilized Preoxygenation: Pre-oxygenation with 100% oxygen Induction Type: IV induction Ventilation: Mask ventilation without difficulty Laryngoscope Size: Mac and 4 Grade View: Grade II Tube type: Oral Tube size: 7.5 mm Number of attempts: 1 Airway Equipment and Method: Stylet and Oral airway Placement Confirmation: ETT inserted through vocal cords under direct vision, positive ETCO2 and breath sounds checked- equal and bilateral Secured at: 23 cm Tube secured with: Tape Dental Injury: Teeth and Oropharynx as per pre-operative assessment

## 2020-12-04 NOTE — Progress Notes (Signed)
   12/04/20 0100  Clinical Encounter Type  Visited With Patient  Visit Type Initial;Social support  Referral From Nurse  Consult/Referral To Chaplain  Spiritual Encounters  Spiritual Needs Emotional  Stress Factors  Patient Stress Factors Loss of control  Family Stress Factors Not reviewed  Chaplain met with patient at trauma room - was able to greet patient and provide ministry of presence.  No spiritual distress detected. No family present, will remain available for any additional spiritual support as needed.    Respectfully submitted,   Mammie Lorenzo
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
## 2020-12-04 NOTE — Progress Notes (Signed)
Pt returned from surgery at 1830. Pt in pain, administered pain med and helped reposition pt. Dressing clean/dry/intact, no drainage in wound vac.  Pt drowsy but able to correctly answer a/o questions and yes/no questions.
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
## 2020-12-04 NOTE — Interval H&P Note (Signed)
All questions answered, patient wants to proceed with procedure. ? ?
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
## 2020-12-04 NOTE — ED Notes (Signed)
The pt arrived back from c-t moaning in pain  Iv lr restarted wide open  Good capalliary refill lt great toe

## 2020-12-04 NOTE — ED Notes (Signed)
Attempted to call report. Consulting civil engineer to assign to nurse. Will call back.

## 2020-12-04 NOTE — ED Notes (Signed)
Pt c/o more pain 

## 2020-12-04 NOTE — ED Notes (Signed)
bp 110/60 p 130

## 2020-12-04 NOTE — Progress Notes (Signed)
Pt off unit to surgery.

## 2020-12-04 NOTE — Transfer of Care (Signed)
Immediate Anesthesia Transfer of Care Note  Patient: Frank Cross  Procedure(s) Performed: INTRAMEDULLARY (IM) NAIL TIBIAL (Left)  Patient Location: PACU  Anesthesia Type:General  Level of Consciousness: awake and drowsy  Airway & Oxygen Therapy: Patient Spontanous Breathing and Patient connected to nasal cannula oxygen  Post-op Assessment: Report given to RN and Post -op Vital signs reviewed and stable  Post vital signs: Reviewed and stable  Last Vitals:  Vitals Value Taken Time  BP    Temp    Pulse    Resp    SpO2      Last Pain:  Vitals:   12/04/20 1439  TempSrc:   PainSc: 9       Patients Stated Pain Goal: 3 (12/04/20 1439)  Complications: No notable events documented.

## 2020-12-04 NOTE — ED Notes (Signed)
Dr Albertha Ghee notified at 0120 by phonw

## 2020-12-04 NOTE — Anesthesia Preprocedure Evaluation (Signed)
Anesthesia Evaluation  Patient identified by MRN, date of birth, ID band Patient awake    Reviewed: Allergy & Precautions, NPO status , Patient's Chart, lab work & pertinent test results  History of Anesthesia Complications Negative for: history of anesthetic complications  Airway Mallampati: III  TM Distance: >3 FB Neck ROM: Full    Dental  (+) Teeth Intact, Dental Advisory Given,    Pulmonary Current Smoker and Patient abstained from smoking.,    breath sounds clear to auscultation       Cardiovascular negative cardio ROS   Rhythm:Regular     Neuro/Psych negative neurological ROS  negative psych ROS   GI/Hepatic negative GI ROS, Neg liver ROS,   Endo/Other  negative endocrine ROS  Renal/GU negative Renal ROS     Musculoskeletal left open tibialf fx   Abdominal   Peds  Hematology negative hematology ROS (+)   Anesthesia Other Findings   Reproductive/Obstetrics                             Anesthesia Physical Anesthesia Plan  ASA: 2  Anesthesia Plan: General   Post-op Pain Management:    Induction: Intravenous  PONV Risk Score and Plan: 1 and Dexamethasone and Ondansetron  Airway Management Planned: Oral ETT  Additional Equipment: None  Intra-op Plan:   Post-operative Plan: Extubation in OR  Informed Consent: I have reviewed the patients History and Physical, chart, labs and discussed the procedure including the risks, benefits and alternatives for the proposed anesthesia with the patient or authorized representative who has indicated his/her understanding and acceptance.     Dental advisory given  Plan Discussed with: CRNA and Anesthesiologist  Anesthesia Plan Comments:         Anesthesia Quick Evaluation
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
## 2020-12-04 NOTE — ED Notes (Signed)
The pt already had  1 gm of ancef on the way here

## 2020-12-04 NOTE — ED Notes (Signed)
The pts wife is at  The bedside.  The pt suddenly calmed down once his wife was in the room

## 2020-12-04 NOTE — ED Notes (Signed)
To c-t accompanied by autumn rn  Pt appears tobe in the same pain  Although he had of fentanyl on the way here  And diaudid1 my shortly after arrival here

## 2020-12-05 ENCOUNTER — Observation Stay (HOSPITAL_COMMUNITY): Payer: Medicaid Other

## 2020-12-05 DIAGNOSIS — S83522A Sprain of posterior cruciate ligament of left knee, initial encounter: Secondary | ICD-10-CM | POA: Diagnosis present

## 2020-12-05 DIAGNOSIS — S0101XA Laceration without foreign body of scalp, initial encounter: Secondary | ICD-10-CM | POA: Diagnosis present

## 2020-12-05 DIAGNOSIS — S83422A Sprain of lateral collateral ligament of left knee, initial encounter: Secondary | ICD-10-CM | POA: Diagnosis present

## 2020-12-05 DIAGNOSIS — S83512A Sprain of anterior cruciate ligament of left knee, initial encounter: Secondary | ICD-10-CM | POA: Diagnosis present

## 2020-12-05 DIAGNOSIS — F17210 Nicotine dependence, cigarettes, uncomplicated: Secondary | ICD-10-CM | POA: Diagnosis present

## 2020-12-05 DIAGNOSIS — S82202B Unspecified fracture of shaft of left tibia, initial encounter for open fracture type I or II: Secondary | ICD-10-CM | POA: Diagnosis not present

## 2020-12-05 DIAGNOSIS — S82252C Displaced comminuted fracture of shaft of left tibia, initial encounter for open fracture type IIIA, IIIB, or IIIC: Secondary | ICD-10-CM | POA: Diagnosis present

## 2020-12-05 DIAGNOSIS — Z20822 Contact with and (suspected) exposure to covid-19: Secondary | ICD-10-CM | POA: Diagnosis present

## 2020-12-05 DIAGNOSIS — S30811A Abrasion of abdominal wall, initial encounter: Secondary | ICD-10-CM | POA: Diagnosis present

## 2020-12-05 LAB — BASIC METABOLIC PANEL
Anion gap: 8 (ref 5–15)
BUN: 9 mg/dL (ref 6–20)
CO2: 27 mmol/L (ref 22–32)
Calcium: 8.7 mg/dL — ABNORMAL LOW (ref 8.9–10.3)
Chloride: 101 mmol/L (ref 98–111)
Creatinine, Ser: 0.95 mg/dL (ref 0.61–1.24)
GFR, Estimated: >60 mL/min (ref >60)
Glucose, Bld: 166 mg/dL — ABNORMAL HIGH (ref 70–99)
Potassium: 4.4 mmol/L (ref 3.5–5.1)
Sodium: 136 mmol/L (ref 135–145)

## 2020-12-05 LAB — CBC
HCT: 31 % — ABNORMAL LOW (ref 39.0–52.0)
Hemoglobin: 10.4 g/dL — ABNORMAL LOW (ref 13.0–17.0)
MCH: 30.1 pg (ref 26.0–34.0)
MCHC: 33.5 g/dL (ref 30.0–36.0)
MCV: 89.9 fL (ref 80.0–100.0)
Platelets: 245 10*3/uL (ref 150–400)
RBC: 3.45 MIL/uL — ABNORMAL LOW (ref 4.22–5.81)
RDW: 13 % (ref 11.5–15.5)
WBC: 11.8 10*3/uL — ABNORMAL HIGH (ref 4.0–10.5)
nRBC: 0 % (ref 0.0–0.2)

## 2020-12-05 MED ORDER — CELECOXIB 200 MG PO CAPS: 200.0000 mg | ORAL_CAPSULE | Freq: Two times a day (BID) | ORAL | 0 refills | Status: AC

## 2020-12-05 MED ORDER — OXYCODONE HCL 5 MG PO TABS: ORAL_TABLET | ORAL | 0 refills | Status: AC

## 2020-12-05 MED ORDER — METHOCARBAMOL 500 MG PO TABS
500.0000 mg | ORAL_TABLET | Freq: Three times a day (TID) | ORAL | 0 refills | Status: DC | PRN
Start: 2020-12-05 — End: 2022-03-03

## 2020-12-05 MED ORDER — ACETAMINOPHEN 500 MG PO TABS: 1000.0000 mg | ORAL_TABLET | Freq: Three times a day (TID) | ORAL | 0 refills | Status: AC

## 2020-12-05 MED ORDER — ASPIRIN 81 MG PO CHEW: 81.0000 mg | CHEWABLE_TABLET | Freq: Two times a day (BID) | ORAL | 0 refills | Status: AC

## 2020-12-05 MED ORDER — ONDANSETRON HCL 4 MG PO TABS: 4.0000 mg | ORAL_TABLET | Freq: Three times a day (TID) | ORAL | 0 refills | Status: AC | PRN

## 2020-12-05 NOTE — Discharge Instructions (Signed)
Frank Marrow MD, MPH Alfonse Alpers, PA-C [facility] [address] [phone] (tel)   [phone] (fax)   POST-OPERATIVE INSTRUCTIONS  WOUND CARE - You may remove the Operative Dressing on Post-Op Day #3 (72hrs after surgery).   - Do NOT remove your prevena wound vac - This should stay on until your follow-up appointment - Alternatively if you would like you can leave dressing on until follow-up if within 7-8 days but keep it dry. - Leave steri-strips in place until they fall off on their own, usually 2 weeks postop. - An ACE wrap may be used to control swelling, do not wrap this too tight.  If the initial ACE wrap feels too tight you may loosen it. - There may be a small amount of fluid/bleeding leaking at the surgical site.  - This is normal; the knee is filled with fluid during the procedure and can leak for 24-48hrs after surgery.  - You may change/reinforce the bandage as needed.  - Use the Cryocuff or Ice as often as possible for the first 7 days, then as needed for pain relief. Always keep a towel, ACE wrap or other barrier between the cooling unit and your skin.  - Do not soak the knee in water or submerge it.  - Do not go swimming in the pool or ocean until 4 weeks after surgery or when otherwise instructed.  Keep dry incisions as dry as possible.   BRACE/AMBULATION - You will be placed in a brace post-operatively.  - Wear your brace at all times until follow-up.  - You may remove for hygiene. -           Use crutches or a walker to help you ambulate -           Touch-down weight bearing: when you stand or walk, you may only touch your foot to the floor for balance -           Do NOT put any body weight on your leg   REGIONAL ANESTHESIA (NERVE BLOCKS) - The anesthesia team may have performed a nerve block for you if safe in the setting of your care.  This is a great tool used to minimize pain.  Typically the block may start wearing off  overnight.  This can be a challenging period but please utilize your as needed pain medications to try and manage this period and know it will be a brief transition as the nerve block wears completely   POST-OP MEDICATIONS - Multimodal approach to pain control - In general your pain will be controlled with a combination of substances.  Prescriptions unless otherwise discussed are electronically sent to your pharmacy.  This is a carefully made plan we use to minimize narcotic use.     - Meloxicam OR Celebrex - Anti-inflammatory medication taken on a scheduled basis - Acetaminophen - Non-narcotic pain medicine taken on a scheduled basis  - Oxycodone - This is a strong narcotic, to be used only on an "as needed" basis for pain. - Robaxin - this is a muscle relaxer, take as needed for muscle spasms - Aspirin 81mg  - This medicine is used to minimize the risk of blood clots after surgery. - Zofran - take as needed for nausea  FOLLOW-UP   Please call the office to schedule a follow-up appointment for your incision check, 7-10 days post-operatively.  IF YOU HAVE ANY QUESTIONS, PLEASE FEEL FREE TO CALL OUR OFFICE.   HELPFUL INFORMATION  -  If you had a block, it will wear off between 8-24 hrs postop typically.  This is period when your pain may go from nearly zero to the pain you would have had post-op without the block.  This is an abrupt transition but nothing dangerous is happening.  You may take an extra dose of narcotic when this happens.   Keep your leg elevated to decrease swelling, which will then in turn decrease your pain. I would elevate the foot of your bed by putting a couple of couch pillows between your mattress and box spring. I would not keep pillow directly under your ankle.  - Do not sleep with a pillow behind your knee even if it is more comfortable as this may make it harder to get your knee fully straight long term.   There will be MORE swelling on days 1-3 than there is on  the day of surgery.  This also is normal. The swelling will decrease with the anti-inflammatory medication, ice and keeping it elevated. The swelling will make it more difficult to bend your knee. As the swelling goes down your motion will become easier   You may develop swelling and bruising that extends from your knee down to your calf and perhaps even to your foot over the next week. Do not be alarmed. This too is normal, and it is due to gravity   There may be some numbness adjacent to the incision site. This may last for 6-12 months or longer in some patients and is expected.   You may return to sedentary work/school in the next couple of days when you feel up to it. You will need to keep your leg elevated as much as possible    You should wean off your narcotic medicines as soon as you are able.  Most patients will be off or using minimal narcotics before their first postop appointment.    We suggest you use the pain medication the first night prior to going to bed, in order to ease any pain when the anesthesia wears off. You should avoid taking pain medications on an empty stomach as it will make you nauseous.   Do not drink alcoholic beverages or take illicit drugs when taking pain medications.   It is against the law to drive while taking narcotics. You cannot drive if your Right leg is in brace locked in extension.   Pain medication may make you constipated.  Below are a few solutions to try in this order:  o Decrease the amount of pain medication if you aren't having pain.  o Drink lots of decaffeinated fluids.  o Drink prune juice and/or each dried prunes   o If the first 3 don't work start with additional solutions  o Take Colace - an over-the-counter stool softener  o Take Senokot - an over-the-counter laxative  o Take Miralax - a stronger over-the-counter laxative   For more information including helpful videos and documents visit our website:    [email or web address]   You have a wound vac on your lower leg You should have been provided a guide with the prevena wound vac at discharge If you have any questions about the wound vac please call the office You can also google, Prevena wound vac and there are instructions online as well
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
## 2020-12-05 NOTE — Progress Notes (Signed)
Pt ready for d/c. Pain well managed on PO pain meds.  Bledsoe brace arrived and applied.  Awaiting BSC, shower chair, and crutches for d/c.  Spoke with [place], [place]. She placed another TOC order for SW/CM to make sure pt gets these for home.  Ready to d/c in the morning.  Chester Holstein is assembled and plugged in to charge. Bedside wound vac still attached, no drainage today.
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
## 2020-12-05 NOTE — Progress Notes (Signed)
Orthopedic Tech Progress Note Patient Details:  Albaraa Swingle May 02, 1985 [id number]  Called in order to [employer] for Bledsoe brace.  Patient ID: Jai Steil, male   DOB: [birth year], 35 y.o.   MRN: [id number]  Docia Furl 12/05/2020, 4:04 PM

## 2020-12-05 NOTE — Progress Notes (Signed)
Called Ortho Tech. She ordering the needed brace and will bring it up when it comes.  Pt pain managed on PO pain meds today.  Pt is excited to be able to get up to go to the bathroom instead of using the urinal.

## 2020-12-06 LAB — CBC
HCT: 33.6 % — ABNORMAL LOW (ref 39.0–52.0)
Hemoglobin: 11.4 g/dL — ABNORMAL LOW (ref 13.0–17.0)
MCH: 30.9 pg (ref 26.0–34.0)
MCHC: 33.9 g/dL (ref 30.0–36.0)
MCV: 91.1 fL (ref 80.0–100.0)
Platelets: 216 10*3/uL (ref 150–400)
RBC: 3.69 MIL/uL — ABNORMAL LOW (ref 4.22–5.81)
RDW: 13.2 % (ref 11.5–15.5)
WBC: 12 10*3/uL — ABNORMAL HIGH (ref 4.0–10.5)
nRBC: 0.3 % — ABNORMAL HIGH (ref 0.0–0.2)

## 2020-12-06 MED ORDER — HYDROMORPHONE HCL 1 MG/ML IJ SOLN
1.0000 mg | INTRAMUSCULAR | Status: AC
Start: 2020-12-06 — End: 2020-12-06
  Administered 2020-12-06: 1 mg via INTRAVENOUS
  Filled 2020-12-06: qty 1

## 2020-12-06 NOTE — Progress Notes (Signed)
Seen by Janine Ores PA.  With new dressings applied, brace refitted and Provena wound vac hooked up. Patient tolerated the activity well.  With 2+ pedal pulses, good sensation, good capillary refill and able to move toes. Denies numbness or tingling sensation. Discharge instructions given to the patient and the patient's mother.  Education emphasized on signs of infection to watch for.  Encouraged to call the doctor's clinic for concerns.  Needs addressed. Discharged home.

## 2020-12-06 NOTE — Progress Notes (Signed)
New dressings place, Bledsoe brace refitted and Provena wound vac hooked up. Patient ok for discharge. He had good 2+ DP pulse, EHL and FHL intact, able to flex and extend all toes, denies any loss of sensation to foot or toes.

## 2020-12-06 NOTE — Anesthesia Postprocedure Evaluation (Signed)
Anesthesia Post Note  Patient: Manan Olmo  Procedure(s) Performed: INTRAMEDULLARY (IM) NAIL TIBIAL (Left)     Patient location during evaluation: PACU Anesthesia Type: General Level of consciousness: awake and alert Pain management: pain level controlled Vital Signs Assessment: post-procedure vital signs reviewed and stable Respiratory status: spontaneous breathing, nonlabored ventilation, respiratory function stable and patient connected to nasal cannula oxygen Cardiovascular status: blood pressure returned to baseline and stable Postop Assessment: no apparent nausea or vomiting Anesthetic complications: no   No notable events documented.  Last Vitals:  Vitals:   12/06/20 0802 12/06/20 1609  BP: (!) 108/59 122/84  Pulse: 68 96  Resp: 18 17  Temp: 36.8 C 36.9 C  SpO2: 98% 99%    Last Pain:  Vitals:   12/06/20 1609  TempSrc: Oral  PainSc:                  Barnet Benavides
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
## 2020-12-06 NOTE — TOC Transition Note (Signed)
Transition of Care [facility]) - CM/SW Discharge Note   Patient Details  Name: Frank Cross MRN: [id number] Date of Birth: [birth year]  [facility] CM/SW Contact:  Kermit Balo, RN Phone Number: 12/06/2020, 11:24 AM   Clinical Narrative:    Patient is discharging home with self care. DME for home to be delivered to the room per [employer].  Cm inquired with PA about outpatient therapy that was recommended. She said to wait and have pt seen in MD office first before this is started.  Pt has transport home.    Final next level of care: Home/Self Care Barriers to Discharge: No Barriers Identified   Patient Goals and CMS Choice     Choice offered to / list presented to : Patient  Discharge Placement                       Discharge Plan and Services                DME Arranged: 3-N-1, Shower stool, Crutches DME Agency: [employer] Date DME Agency Contacted: 12/06/20   Representative spoke with at DME Agency: Jasmin            Social Determinants of Health (SDOH) Interventions     Readmission Risk Interventions No flowsheet data found.

## 2020-12-06 NOTE — Progress Notes (Addendum)
Pt c/o unrelieved pain from prn medicine. Pt was able to move toes earlier in day and flex ankle, pt had great difficulty with both tasks.l Pt also said he has a decrease in sensation in lower left leg/foot. On call Dr. Luberta Robertson and waiting for next steps.Dr. ordered 1 mg of dilaudid with relief of pain of 3 compared to original 10. Pedal pulses present on dorsal side. Foot unwrapped from ace bandage and Kerlix and Ice applied with relief and return of ability of flexion and extension of ankle and wiggling of toes. NP made aware no other needs noted at this time. Reva Bores 12/06/20 1:02 AM

## 2020-12-07 ENCOUNTER — Encounter (HOSPITAL_COMMUNITY): Payer: Self-pay | Admitting: Orthopaedic Surgery

## 2020-12-10 NOTE — Patient Instructions (Addendum)
DUE TO COVID-19 ONLY ONE VISITOR IS ALLOWED TO COME WITH YOU AND STAY IN THE WAITING ROOM ONLY DURING PRE OP AND PROCEDURE.   **NO VISITORS ARE ALLOWED IN THE SHORT STAY AREA OR RECOVERY ROOM!!**  IF YOU WILL BE ADMITTED INTO THE HOSPITAL YOU ARE ALLOWED ONLY TWO SUPPORT PEOPLE DURING VISITATION HOURS ONLY (10AM -8PM)   The support person(s) may change daily. The support person(s) must pass our screening, gel in and out, and wear a mask at all times, including in the patient's room. Patients must also wear a mask when staff or their support person are in the room.  No visitors under the age of 23. Any visitor under the age of 63 must be accompanied by an adult.     Your procedure is scheduled on: 12/15/20   Report to [facility] Main  Entrance    Report to admitting at: 8:45 AM   Call this number if you have problems the morning of surgery [phone]   Do not eat food :After Midnight.   May have liquids until  : 8:00 AM.  day of surgery  CLEAR LIQUID DIET  Foods Allowed                                                                     Foods Excluded  Water, Black Coffee and tea, regular and decaf                             liquids that you cannot  Plain Jell-O in any flavor  (No red)                                           see through such as: Fruit ices (not with fruit pulp)                                     milk, soups, orange juice              Iced Popsicles (No red)                                    All solid food                                   Apple juices Sports drinks like Gatorade (No red) Lightly seasoned clear broth or consume(fat free) Sugar,   Sample Menu Breakfast                                Lunch                                     Supper Cranberry juice  Beef broth                            Chicken broth Jell-O                                     Grape juice                           Apple juice Coffee or tea                         Jell-O                                      Popsicle                                                Coffee or tea                        Coffee or tea      Complete one Ensure drink the morning of surgery at : 8:00 AM.  the day of surgery.       Oral Hygiene is also important to reduce your risk of infection.                                    Remember - BRUSH YOUR TEETH THE MORNING OF SURGERY WITH YOUR REGULAR TOOTHPASTE   Do NOT smoke after Midnight   Take these medicines the morning of surgery with A SIP OF WATER: N/A  DO NOT TAKE ANY ORAL DIABETIC MEDICATIONS DAY OF YOUR SURGERY                              You may not have any metal on your body including hair pins, jewelry, and body piercing             Do not wear lotions, powders, perfumes/cologne, or deodorant               Men may shave face and neck.   Do not bring valuables to the hospital. [facility] IS NOT             RESPONSIBLE   FOR VALUABLES.   Contacts, dentures or bridgework may not be worn into surgery.    Patients discharged the day of surgery will not be allowed to drive home.   Special Instructions: Bring a copy of your healthcare power of attorney and living will documents         the day of surgery if you haven't scanned them in before.              Please read over the following fact sheets you were given: IF YOU HAVE QUESTIONS ABOUT YOUR PRE OP INSTRUCTIONS PLEASE CALL [phone]  [facility] - Preparing for Surgery Before surgery, you can play an important role.  Because skin is not sterile, your skin needs to be as free of germs as possible.  You can reduce the number of germs on your skin by washing with CHG (chlorahexidine gluconate) soap before surgery.  CHG is an antiseptic cleaner which kills germs and bonds with the skin to continue killing germs even after washing. Please DO NOT use if you have an allergy to CHG or antibacterial soaps.  If  your skin becomes reddened/irritated stop using the CHG and inform your nurse when you arrive at Short Stay. Do not shave (including legs and underarms) for at least 48 hours prior to the first CHG shower.  You may shave your face/neck. Please follow these instructions carefully:  1.  Shower with CHG Soap the night before surgery and the  morning of Surgery.  2.  If you choose to wash your hair, wash your hair first as usual with your  normal  shampoo.  3.  After you shampoo, rinse your hair and body thoroughly to remove the  shampoo.                           4.  Use CHG as you would any other liquid soap.  You can apply chg directly  to the skin and wash                       Gently with a scrungie or clean washcloth.  5.  Apply the CHG Soap to your body ONLY FROM THE NECK DOWN.   Do not use on face/ open                           Wound or open sores. Avoid contact with eyes, ears mouth and genitals (private parts).                       Wash face,  Genitals (private parts) with your normal soap.             6.  Wash thoroughly, paying special attention to the area where your surgery  will be performed.  7.  Thoroughly rinse your body with warm water from the neck down.  8.  DO NOT shower/wash with your normal soap after using and rinsing off  the CHG Soap.                9.  Pat yourself dry with a clean towel.            10.  Wear clean pajamas.            11.  Place clean sheets on your bed the night of your first shower and do not  sleep with pets. Day of Surgery : Do not apply any lotions/deodorants the morning of surgery.  Please wear clean clothes to the hospital/surgery center.  FAILURE TO FOLLOW THESE INSTRUCTIONS MAY RESULT IN THE CANCELLATION OF YOUR SURGERY PATIENT SIGNATURE_________________________________  NURSE SIGNATURE__________________________________  ________________________________________________________________________

## 2020-12-10 NOTE — Progress Notes (Signed)
Pt. Needs order for upcoming surgery.PAT on 12/13/20.

## 2020-12-13 ENCOUNTER — Other Ambulatory Visit: Payer: Self-pay

## 2020-12-13 ENCOUNTER — Encounter (HOSPITAL_COMMUNITY)
Admission: RE | Admit: 2020-12-13 | Discharge: 2020-12-13 | Disposition: A | Discharge status: Home / Self Care | Payer: Medicaid Other | Source: Ambulatory Visit | Attending: Orthopaedic Surgery | Admitting: Orthopaedic Surgery

## 2020-12-13 ENCOUNTER — Encounter (HOSPITAL_COMMUNITY): Payer: Self-pay

## 2020-12-13 DIAGNOSIS — Z01818 Encounter for other preprocedural examination: Secondary | ICD-10-CM | POA: Insufficient documentation

## 2020-12-13 NOTE — Progress Notes (Signed)
COVID Vaccine Completed: No Date COVID Vaccine completed: COVID vaccine manufacturer: Pfizer    Moderna   [employer]   PCP - NO PCP Cardiologist -   Chest x-ray -  EKG -  Stress Test -  ECHO -  Cardiac Cath -  Pacemaker/ICD device last checked:  Sleep Study -  CPAP -   Fasting Blood Sugar -  Checks Blood Sugar _____ times a day  Blood Thinner Instructions: Aspirin Instructions: Last Dose:  Anesthesia review: Hx: smoke  Patient denies shortness of breath, fever, cough and chest pain at PAT appointment   Patient verbalized understanding of instructions that were given to them at the PAT appointment. Patient was also instructed that they will need to review over the PAT instructions again at home before surgery.

## 2020-12-14 ENCOUNTER — Ambulatory Visit (HOSPITAL_COMMUNITY): Payer: Medicaid Other | Admitting: Registered Nurse

## 2020-12-14 NOTE — H&P (Signed)
PREOPERATIVE H&P  Chief Complaint: left knee instability  HPI: Frank Cross is a 35 y.o. male who is scheduled for, Procedure(s): KNEE ARTHROSCOPY WITH POSTERIOR CRUCIATE LIGAMENT (PCL) RECONSTRUCTION posterior lateral corner reconstruction.   Patient has a past medical history significant for left open tibia fracture.   Patient had a left open tibia wash out and closure with IM nailing on 12/04/2020. He also suffered an acute posterior lateral corner and PCL injury, possible ACL injury.   His symptoms are rated as moderate to severe, and have been worsening.  This is significantly impairing activities of daily living.    Please see clinic note for further details on this patient's care.    He has elected for surgical management.   Past Medical History:  Diagnosis Date   ADHD (attention deficit hyperactivity disorder)    Depression    Post-traumatic stress disorder    Past Surgical History:  Procedure Laterality Date   APPLICATION OF WOUND VAC Left 12/04/2020   Procedure: APPLICATION OF WOUND VAC;  Surgeon: Bjorn Pippin, MD;  Location: [facility] OR;  Service: Orthopedics;  Laterality: Left;   TIBIA IM NAIL INSERTION Left 12/04/2020   Procedure: INTRAMEDULLARY (IM) NAIL TIBIAL;  Surgeon: Bjorn Pippin, MD;  Location: [facility] OR;  Service: Orthopedics;  Laterality: Left;   TONSILLECTOMY     Social History   Socioeconomic History   Marital status: Unknown    Spouse name: Not on file   Number of children: Not on file   Years of education: Not on file   Highest education level: Not on file  Occupational History   Not on file  Tobacco Use   Smoking status: Every Day    Packs/day: 2.00    Years: 15.00    Pack years: 30.00    Types: Cigarettes    Passive exposure: Never   Smokeless tobacco: Current  Vaping Use   Vaping Use: Some days  Substance and Sexual Activity   Alcohol use: Not Currently    Comment: Occasionally drinker-says he is going to stop because it doesn't help    Drug use: No   Sexual activity: Yes    Birth control/protection: None  Other Topics Concern   Not on file  Social History Narrative   ** Merged History Encounter **       Social Determinants of Health   Financial Resource Strain: Not on file  Food Insecurity: Not on file  Transportation Needs: Not on file  Physical Activity: Not on file  Stress: Not on file  Social Connections: Not on file   No family history on file. Allergies  Allergen Reactions   Iodine Itching and Rash    Burning, itching when used topically.    Prior to Admission medications   Medication Sig Start Date End Date Taking? Authorizing Provider  acetaminophen (TYLENOL) 500 MG tablet Take 2 tablets (1,000 mg total) by mouth every 8 (eight) hours for 14 days. 12/05/20 12/19/20 Yes Janine Ores K, PA-C  celecoxib (CELEBREX) 200 MG capsule Take 1 capsule (200 mg total) by mouth 2 (two) times daily. 12/05/20 01/04/21 Yes Janine Ores K, PA-C  methocarbamol (ROBAXIN) 500 MG tablet Take 1 tablet (500 mg total) by mouth every 8 (eight) hours as needed for muscle spasms. 12/05/20  Yes Janine Ores K, PA-C  aspirin (ASPIRIN CHILDRENS) 81 MG chewable tablet Chew 1 tablet (81 mg total) by mouth 2 (two) times daily. For DVT prophylaxis after surgery 12/05/20 01/16/21  Armida Sans, PA-C  buPROPion (WELLBUTRIN XL) 150 MG 24 hr tablet Take 1 tablet (150 mg total) by mouth daily. Patient not taking: Reported on 05/13/2015 04/24/13 05/13/15  Beau Fanny, FNP  traZODone (DESYREL) 50 MG tablet Take 1 tablet (50 mg total) by mouth at bedtime and may repeat dose one time if needed. Patient not taking: No sig reported 04/24/13 05/13/15  [place]row, Everardo All, FNP    ROS: All other systems have been reviewed and were otherwise negative with the exception of those mentioned in the HPI and as above.  Physical Exam: General: Alert, no acute distress Cardiovascular: No pedal edema Respiratory: No cyanosis, no use of accessory musculature GI: No  organomegaly, abdomen is soft and non-tender Skin: No lesions in the area of chief complaint Neurologic: Sensation intact distally Psychiatric: Patient is competent for consent with normal mood and affect Lymphatic: No axillary or cervical lymphadenopathy  MUSCULOSKELETAL:  The range of motion of the knee is limited secondary to know surgery. Incisions are benign. Distal motor and sensory function intact.   Imaging: MRI which demonstrates an acute avulsion of the posterior lateral corner with a small fibular head component. He additionally has what looks like a complete PCL injury and some signal in the ACL which may be a small partial injury versus full. The tibial nail appears to be in appropriate alignment. The overall alignment of the fracture appears to be good.   Assessment: left knee instability  Plan: Plan for Procedure(s): KNEE ARTHROSCOPY WITH POSTERIOR CRUCIATE LIGAMENT (PCL) RECONSTRUCTION posterior lateral corner reconstruction  The risks benefits and alternatives were discussed with the patient including but not limited to the risks of nonoperative treatment, versus surgical intervention including infection, bleeding, nerve injury,  blood clots, cardiopulmonary complications, morbidity, mortality, among others, and they were willing to proceed.   The patient acknowledged the explanation, agreed to proceed with the plan and consent was signed.   Operative Plan: Left knee scope with PCL, PLC, and possible ACL reconstruction Discharge Medications: Standard DVT Prophylaxis: Aspirin Physical Therapy: Outpatient PT Special Discharge needs: Knee immobilizer. IceMan   Vernetta Honey, PA-C  12/14/2020 12:47 PM

## 2020-12-15 ENCOUNTER — Encounter (HOSPITAL_COMMUNITY): Payer: Self-pay | Admitting: Orthopaedic Surgery

## 2020-12-15 ENCOUNTER — Ambulatory Visit (HOSPITAL_COMMUNITY)
Admission: RE | Admit: 2020-12-15 | Discharge: 2020-12-15 | Disposition: A | Discharge status: Home / Self Care | Payer: Medicaid Other | Attending: Orthopaedic Surgery | Admitting: Orthopaedic Surgery

## 2020-12-15 ENCOUNTER — Other Ambulatory Visit (HOSPITAL_COMMUNITY): Payer: Self-pay

## 2020-12-15 ENCOUNTER — Encounter (HOSPITAL_COMMUNITY)
Admission: RE | Disposition: A | Discharge status: Home / Self Care | Payer: Self-pay | Source: Home / Self Care | Attending: Orthopaedic Surgery

## 2020-12-15 DIAGNOSIS — X58XXXA Exposure to other specified factors, initial encounter: Secondary | ICD-10-CM | POA: Diagnosis not present

## 2020-12-15 DIAGNOSIS — Z5309 Procedure and treatment not carried out because of other contraindication: Secondary | ICD-10-CM | POA: Insufficient documentation

## 2020-12-15 DIAGNOSIS — Y939 Activity, unspecified: Secondary | ICD-10-CM | POA: Insufficient documentation

## 2020-12-15 DIAGNOSIS — S82252C Displaced comminuted fracture of shaft of left tibia, initial encounter for open fracture type IIIA, IIIB, or IIIC: Secondary | ICD-10-CM | POA: Diagnosis present

## 2020-12-15 DIAGNOSIS — Z883 Allergy status to other anti-infective agents status: Secondary | ICD-10-CM | POA: Diagnosis not present

## 2020-12-15 DIAGNOSIS — F17210 Nicotine dependence, cigarettes, uncomplicated: Secondary | ICD-10-CM | POA: Insufficient documentation

## 2020-12-15 DIAGNOSIS — M25362 Other instability, left knee: Secondary | ICD-10-CM | POA: Diagnosis not present

## 2020-12-15 DIAGNOSIS — S83422A Sprain of lateral collateral ligament of left knee, initial encounter: Secondary | ICD-10-CM | POA: Insufficient documentation

## 2020-12-15 DIAGNOSIS — Z7982 Long term (current) use of aspirin: Secondary | ICD-10-CM | POA: Diagnosis not present

## 2020-12-15 DIAGNOSIS — Z79899 Other long term (current) drug therapy: Secondary | ICD-10-CM | POA: Insufficient documentation

## 2020-12-15 SURGERY — ARTHROSCOPY, KNEE, WITH PCL RECONSTRUCTION
Anesthesia: General | Site: Knee | Laterality: Left

## 2020-12-15 MED ORDER — MIDAZOLAM HCL 2 MG/2ML IJ SOLN
1.0000 mg | Freq: Once | INTRAMUSCULAR | Status: AC
  Administered 2020-12-15: 2 mg via INTRAVENOUS
  Filled 2020-12-15: qty 2

## 2020-12-15 MED ORDER — CEFAZOLIN SODIUM-DEXTROSE 2-4 GM/100ML-% IV SOLN
2.0000 g | INTRAVENOUS | Status: DC
  Filled 2020-12-15: qty 100

## 2020-12-15 MED ORDER — DEXAMETHASONE SODIUM PHOSPHATE 10 MG/ML IJ SOLN
INTRAMUSCULAR | Status: AC
  Filled 2020-12-15: qty 1

## 2020-12-15 MED ORDER — CHLORHEXIDINE GLUCONATE 0.12 % MT SOLN
15.0000 mL | Freq: Once | OROMUCOSAL | Status: AC
  Administered 2020-12-15: 15 mL via OROMUCOSAL

## 2020-12-15 MED ORDER — ONDANSETRON HCL 4 MG/2ML IJ SOLN
INTRAMUSCULAR | Status: AC
  Filled 2020-12-15: qty 2

## 2020-12-15 MED ORDER — LIDOCAINE 2% (20 MG/ML) 5 ML SYRINGE
INTRAMUSCULAR | Status: AC
  Filled 2020-12-15: qty 5

## 2020-12-15 MED ORDER — BUPIVACAINE-EPINEPHRINE (PF) 0.25% -1:200000 IJ SOLN
INTRAMUSCULAR | Status: AC | PRN
  Administered 2020-12-15: 30 mL via PERINEURAL

## 2020-12-15 MED ORDER — LACTATED RINGERS IV SOLN: INTRAVENOUS | Status: DC

## 2020-12-15 MED ORDER — ORAL CARE MOUTH RINSE: 15.0000 mL | Freq: Once | OROMUCOSAL | Status: AC

## 2020-12-15 MED ORDER — PROPOFOL 10 MG/ML IV BOLUS
INTRAVENOUS | Status: AC
  Filled 2020-12-15: qty 20

## 2020-12-15 MED ORDER — SULFAMETHOXAZOLE-TRIMETHOPRIM 800-160 MG PO TABS
1.0000 | ORAL_TABLET | Freq: Two times a day (BID) | ORAL | 0 refills | Status: AC
  Filled 2020-12-15: qty 28, 14d supply, fill #0

## 2020-12-15 MED ORDER — FENTANYL CITRATE PF 50 MCG/ML IJ SOSY
50.0000 ug | PREFILLED_SYRINGE | Freq: Once | INTRAMUSCULAR | Status: AC
  Administered 2020-12-15: 100 ug via INTRAVENOUS
  Filled 2020-12-15: qty 2

## 2020-12-15 MED ORDER — FENTANYL CITRATE (PF) 100 MCG/2ML IJ SOLN
INTRAMUSCULAR | Status: AC
  Filled 2020-12-15: qty 2

## 2020-12-15 MED ORDER — ROPIVACAINE HCL 5 MG/ML IJ SOLN
INTRAMUSCULAR | Status: AC | PRN
  Administered 2020-12-15: 20 mL via PERINEURAL

## 2020-12-15 NOTE — Progress Notes (Signed)
Per Dr. Everardo Pacific, surgery will be canceled due to possible infection to the surgical site.  Applied dressing to left leg and then discharge home per MD. Dr. Sampson Goon aware. Came and talked to patient. Ok to discharge per anesthesia since patient is at baseline post block.  Wife and mom will be taking patient home. Pt alert and oriented x 4; no s/s of distress noted.

## 2020-12-15 NOTE — Anesthesia Procedure Notes (Signed)
Anesthesia Regional Block: Popliteal block   Pre-Anesthetic Checklist: , timeout performed,  Correct Patient, Correct Site, Correct Laterality,  Correct Procedure, Correct Position, site marked,  Risks and benefits discussed,  Surgical consent,  Pre-op evaluation,  At surgeon's request and post-op pain management  Laterality: Left  Prep: chloraprep       Needles:  Injection technique: Single-shot  Needle Type: Echogenic Needle     Needle Length: 9cm  Needle Gauge: 21     Additional Needles:   Procedures:,,,, ultrasound used (permanent image in chart),,    Narrative:  Start time: 12/15/2020 11:44 AM End time: 12/15/2020 11:50 AM Injection made incrementally with aspirations every 5 mL.  Performed by: Personally  Anesthesiologist: Marcene Duos, MD

## 2020-12-15 NOTE — Anesthesia Procedure Notes (Signed)
Anesthesia Regional Block: Adductor canal block   Pre-Anesthetic Checklist: , timeout performed,  Correct Patient, Correct Site, Correct Laterality,  Correct Procedure, Correct Position, site marked,  Risks and benefits discussed,  Surgical consent,  Pre-op evaluation,  At surgeon's request and post-op pain management  Laterality: Left  Prep: chloraprep       Needles:  Injection technique: Single-shot  Needle Type: Echogenic Needle     Needle Length: 9cm  Needle Gauge: 21     Additional Needles:   Procedures:,,,, ultrasound used (permanent image in chart),,    Narrative:  Start time: 12/15/2020 11:50 AM End time: 12/15/2020 11:56 AM Injection made incrementally with aspirations every 5 mL.  Performed by: Personally  Anesthesiologist: Marcene Duos, MD

## 2020-12-15 NOTE — Anesthesia Preprocedure Evaluation (Addendum)
Anesthesia Evaluation  Patient identified by MRN, date of birth, ID band Patient awake    Reviewed: Allergy & Precautions, NPO status , Patient's Chart, lab work & pertinent test results  Airway Mallampati: II  TM Distance: >3 FB Neck ROM: Full    Dental  (+) Dental Advisory Given   Pulmonary Current Smoker and Patient abstained from smoking.,    breath sounds clear to auscultation       Cardiovascular negative cardio ROS   Rhythm:Regular Rate:Normal     Neuro/Psych negative neurological ROS     GI/Hepatic negative GI ROS, Neg liver ROS,   Endo/Other  negative endocrine ROS  Renal/GU negative Renal ROS     Musculoskeletal   Abdominal   Peds  Hematology negative hematology ROS (+)   Anesthesia Other Findings   Reproductive/Obstetrics                             Lab Results  Component Value Date   WBC 12.0 (H) 12/06/2020   HGB 11.4 (L) 12/06/2020   HCT 33.6 (L) 12/06/2020   MCV 91.1 12/06/2020   PLT 216 12/06/2020   Lab Results  Component Value Date   CREATININE 0.95 12/05/2020   BUN 9 12/05/2020   NA 136 12/05/2020   K 4.4 12/05/2020   CL 101 12/05/2020   CO2 27 12/05/2020    Anesthesia Physical Anesthesia Plan  ASA: 2  Anesthesia Plan: General   Post-op Pain Management:  Regional for Post-op pain   Induction: Intravenous  PONV Risk Score and Plan: 1 and Dexamethasone, Ondansetron and Treatment may vary due to age or medical condition  Airway Management Planned: LMA  Additional Equipment:   Intra-op Plan:   Post-operative Plan: Extubation in OR  Informed Consent: I have reviewed the patients History and Physical, chart, labs and discussed the procedure including the risks, benefits and alternatives for the proposed anesthesia with the patient or authorized representative who has indicated his/her understanding and acceptance.     Dental advisory given  Plan  Discussed with: CRNA  Anesthesia Plan Comments: (About 30 min after blocking the patient Dr. Everardo Pacific came to speak with the patient and unwrapped the patients leg. He discovered that one of his distal wounds from his previous operation is infected and is cancelling his surgery today for a trial of abx. Pt is non-weight bearing to surgical and blocked limb and precautions discussed with patient.  Glade Stanford, MD)       Anesthesia Quick Evaluation

## 2020-12-15 NOTE — Progress Notes (Signed)
Assisted Dr. Marcene Duos with left knee adductor canal block and Popliteal  block. Side rails up, monitors on throughout procedure. See vital signs in flow sheet. Tolerated Procedure well.

## 2021-01-05 NOTE — Progress Notes (Signed)
Sent message, via epic in basket, requesting orders in epic from surgeon.  
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
## 2021-01-11 NOTE — Progress Notes (Signed)
DUE TO COVID-19 ONLY ONE VISITOR IS ALLOWED TO COME WITH YOU AND STAY IN THE WAITING ROOM ONLY DURING PRE OP AND PROCEDURE DAY OF SURGERY.  2 VISITOR  MAY VISIT WITH YOU AFTER SURGERY IN YOUR PRIVATE ROOM DURING VISITING HOURS ONLY!  YOU NEED TO HAVE A COVID 19 TEST ON______AME@_  @_from  8am-3pm _____, THIS TEST MUST BE DONE BEFORE SURGERY,  Covid test is done at [address] [place], [place] [address].  This is a drive thru.  No appt required. Please see map.                 Your procedure is scheduled on:  01/26/21   Report to [facility] Main  Entrance   Report to admitting at    [phone]     Call this number if you have problems the morning of surgery [phone]    REMEMBER: NO  SOLID FOOD CANDY OR GUM AFTER MIDNIGHT. CLEAR LIQUIDS UNTIL    0430AM      . NOTHING BY MOUTH EXCEPT CLEAR LIQUIDS UNTIL  0430AM   . PLEASE FINISH ENSURE DRINK PER SURGEON ORDER  WHICH NEEDS TO BE COMPLETED AT   0430AM    .      CLEAR LIQUID DIET   Foods Allowed                                                                    Coffee and tea, regular and decaf                            Fruit ices (not with fruit pulp)                                      Iced Popsicles                                    Carbonated beverages, regular and diet                                    Cranberry, grape and apple juices Sports drinks like Gatorade Lightly seasoned clear broth or consume(fat free) Sugar, honey syrup ___________________________________________________________________      BRUSH YOUR TEETH MORNING OF SURGERY AND RINSE YOUR MOUTH OUT, NO CHEWING GUM CANDY OR MINTS.     Take these medicines the morning of surgery with A SIP OF WATER:   NONE   DO NOT TAKE ANY DIABETIC MEDICATIONS DAY OF YOUR SURGERY                               You may not have any metal on your body including hair pins and              piercings  Do not wear jewelry, make-up, lotions, powders or perfumes,  deodorant             Do not wear nail  polish on your fingernails.  Do not shave  48 hours prior to surgery.              Men may shave face and neck.   Do not bring valuables to the hospital. [place].  Contacts, dentures or bridgework may not be worn into surgery.  Leave suitcase in the car. After surgery it may be brought to your room.     Patients discharged the day of surgery will not be allowed to drive home. IF YOU ARE HAVING SURGERY AND GOING HOME THE SAME DAY, YOU MUST HAVE AN ADULT TO DRIVE YOU HOME AND BE WITH YOU FOR 24 HOURS. YOU MAY GO HOME BY TAXI OR UBER OR ORTHERWISE, BUT AN ADULT MUST ACCOMPANY YOU HOME AND STAY WITH YOU FOR 24 HOURS.  Name and phone number of your driver:  Special Instructions: N/A              Please read over the following fact sheets you were given: _____________________________________________________________________  [facility] - Preparing for Surgery Before surgery, you can play an important role.  Because skin is not sterile, your skin needs to be as free of germs as possible.  You can reduce the number of germs on your skin by washing with CHG (chlorahexidine gluconate) soap before surgery.  CHG is an antiseptic cleaner which kills germs and bonds with the skin to continue killing germs even after washing. Please DO NOT use if you have an allergy to CHG or antibacterial soaps.  If your skin becomes reddened/irritated stop using the CHG and inform your nurse when you arrive at Short Stay. Do not shave (including legs and underarms) for at least 48 hours prior to the first CHG shower.  You may shave your face/neck. Please follow these instructions carefully:  1.  Shower with CHG Soap the night before surgery and the  morning of Surgery.  2.  If you choose to wash your hair, wash your hair first as usual with your  normal  shampoo.  3.  After you shampoo, rinse your hair and body thoroughly to remove  the  shampoo.                           4.  Use CHG as you would any other liquid soap.  You can apply chg directly  to the skin and wash                       Gently with a scrungie or clean washcloth.  5.  Apply the CHG Soap to your body ONLY FROM THE NECK DOWN.   Do not use on face/ open                           Wound or open sores. Avoid contact with eyes, ears mouth and genitals (private parts).                       Wash face,  Genitals (private parts) with your normal soap.             6.  Wash thoroughly, paying special attention to the area where your surgery  will be performed.  7.  Thoroughly rinse your body with warm water from the  neck down.  8.  DO NOT shower/wash with your normal soap after using and rinsing off  the CHG Soap.                9.  Pat yourself dry with a clean towel.            10.  Wear clean pajamas.            11.  Place clean sheets on your bed the night of your first shower and do not  sleep with pets. Day of Surgery : Do not apply any lotions/deodorants the morning of surgery.  Please wear clean clothes to the hospital/surgery center.  FAILURE TO FOLLOW THESE INSTRUCTIONS MAY RESULT IN THE CANCELLATION OF YOUR SURGERY PATIENT SIGNATURE_________________________________  NURSE SIGNATURE__________________________________  ________________________________________________________________________

## 2021-01-13 ENCOUNTER — Encounter (HOSPITAL_COMMUNITY): Payer: Self-pay

## 2021-01-13 ENCOUNTER — Encounter (HOSPITAL_COMMUNITY)
Admission: RE | Admit: 2021-01-13 | Discharge: 2021-01-13 | Disposition: A | Discharge status: Home / Self Care | Payer: Medicaid Other | Source: Ambulatory Visit | Attending: Anesthesiology | Admitting: Anesthesiology

## 2021-01-14 ENCOUNTER — Other Ambulatory Visit: Payer: Self-pay

## 2021-01-14 ENCOUNTER — Encounter (HOSPITAL_COMMUNITY): Payer: Self-pay

## 2021-01-17 NOTE — Progress Notes (Signed)
Anesthesia Chart Review   Case: [id number] Date/Time: 01/26/21 0715   Procedure: KNEE ARTHROSCOPY WITH ANTERIOR CRUCIATE LIGAMENT (ACL) AND POSTERIOR CRUCIATE LIGAMENT (PCL) POST-LAT CORNER (PLC) REPAIRS (Left: Knee)   Anesthesia type: Choice   Pre-op diagnosis: LEFT KNEE ANTERIOR CRUCIATE LIGAMENT, POSTERIOR CRUCIATE LIGAMENT TEAR, LEFT ACHILLES TENDON RUPTURE, POSTERIOR LATERAL CORNER   Location: [facility] ROOM 06 / [facility]   Surgeons: Bjorn Pippin, MD       DISCUSSION:35 y.o. every day smoker with h/o left knee ACL and PCL tear, left achilles tendon rupture scheduled for above procedure 01/26/2021 with Dr. Ramond Marrow.   Surgery previously scheduled 12/15/2020. Cancelled due to possible surgical site infection.  Evaluate DOS. Pt not seen in PAT clinic, visit done over the phone.  VS: There were no vitals taken for this visit.  PROVIDERS: Practice, [facility]   LABS:  labs DOS (all labs ordered are listed, but only abnormal results are displayed)  Labs Reviewed - No data to display   IMAGES:   EKG:   CV:  Past Medical History:  Diagnosis Date   ADHD (attention deficit hyperactivity disorder)    Depression    Post-traumatic stress disorder     Past Surgical History:  Procedure Laterality Date   APPLICATION OF WOUND VAC Left 12/04/2020   Procedure: APPLICATION OF WOUND VAC;  Surgeon: Bjorn Pippin, MD;  Location: [facility];  Service: Orthopedics;  Laterality: Left;   TIBIA IM NAIL INSERTION Left 12/04/2020   Procedure: INTRAMEDULLARY (IM) NAIL TIBIAL;  Surgeon: Bjorn Pippin, MD;  Location: [facility];  Service: Orthopedics;  Laterality: Left;   TONSILLECTOMY      MEDICATIONS:  acetaminophen (TYLENOL) 500 MG tablet   methocarbamol (ROBAXIN) 500 MG tablet   No current facility-administered medications for this encounter.    bupivacaine-epinephrine (MARCAINE W/ EPI) 0.25% -1:200000 injection   ropivacaine (PF) 5 mg/mL (0.5%) (NAROPIN) injection    Jodell Cipro Ward,  PA-C [facility]-Surgical Testing [phone]

## 2021-01-20 NOTE — Patient Instructions (Signed)
DUE TO COVID-19 ONLY ONE VISITOR IS ALLOWED TO COME WITH YOU AND STAY IN THE WAITING ROOM ONLY DURING PRE OP AND PROCEDURE.   **NO VISITORS ARE ALLOWED IN THE SHORT STAY AREA OR RECOVERY ROOM!!**  IF YOU WILL BE ADMITTED INTO THE HOSPITAL YOU ARE ALLOWED ONLY TWO SUPPORT PEOPLE DURING VISITATION HOURS ONLY (10AM -8PM)   The support person(s) may change daily. The support person(s) must pass our screening, gel in and out, and wear a mask at all times, including in the patient's room. Patients must also wear a mask when staff or their support person are in the room.  No visitors under the age of 53. Any visitor under the age of 41 must be accompanied by an adult.        Your procedure is scheduled on: 01/26/21   Report to [facility] Main Entrance   Report to Short Stay at 5:15 AM   [facility])   Call this number if you have problems the morning of surgery [phone]   Do not eat food :After Midnight.   May have liquids until: 4:30 AM    day of surgery  CLEAR LIQUID DIET  Foods Allowed                                                                     Foods Excluded  Water, Black Coffee and tea, regular and decaf                             liquids that you cannot  Plain Jell-O in any flavor  (No red)                                           see through such as: Fruit ices (not with fruit pulp)                                     milk, soups, orange juice              Iced Popsicles (No red)                                    All solid food                                   Apple juices Sports drinks like Gatorade (No red) Lightly seasoned clear broth or consume(fat free) Sugar,   Sample Menu Breakfast                                Lunch                                     Supper Cranberry juice  Beef broth                            Chicken broth Jell-O                                     Grape juice                           Apple  juice Coffee or tea                        Jell-O                                      Popsicle                                                Coffee or tea                        Coffee or tea      Complete one Ensure drink the morning of surgery at : 4:30 AM      the day of surgery.    The day of surgery:  Drink ONE (1) Pre-Surgery Clear Ensure or G2 by am the morning of surgery. Drink in one sitting. Do not sip.  This drink was given to you during your hospital  pre-op appointment visit. Nothing else to drink after completing the  Pre-Surgery Clear Ensure or G2.          If you have questions, please contact your surgeon's office.    Oral Hygiene is also important to reduce your risk of infection.                                    Remember - BRUSH YOUR TEETH THE MORNING OF SURGERY WITH YOUR REGULAR TOOTHPASTE   Do NOT smoke after Midnight   Take these medicines the morning of surgery with A SIP OF WATER: N/A  DO NOT TAKE ANY ORAL DIABETIC MEDICATIONS DAY OF YOUR SURGERY                              You may not have any metal on your body including hair pins, jewelry, and body piercing             Do not wear lotions, powders, perfumes/cologne, or deodorant              Men may shave face and neck.   Do not bring valuables to the hospital. [facility] IS NOT             RESPONSIBLE   FOR VALUABLES.   Contacts, dentures or bridgework may not be worn into surgery.   Bring small overnight bag day of surgery.    Patients discharged on the day of surgery will not be allowed to drive home.   Special Instructions: Bring a copy of your healthcare power of  attorney and living will documents         the day of surgery if you haven't scanned them before.              Please read over the following fact sheets you were given: IF YOU HAVE QUESTIONS ABOUT YOUR PRE-OP INSTRUCTIONS PLEASE CALL [phone]   [facility] - Preparing for Surgery Before surgery, you can play an important  role.  Because skin is not sterile, your skin needs to be as free of germs as possible.  You can reduce the number of germs on your skin by washing with CHG (chlorahexidine gluconate) soap before surgery.  CHG is an antiseptic cleaner which kills germs and bonds with the skin to continue killing germs even after washing. Please DO NOT use if you have an allergy to CHG or antibacterial soaps.  If your skin becomes reddened/irritated stop using the CHG and inform your nurse when you arrive at Short Stay. Do not shave (including legs and underarms) for at least 48 hours prior to the first CHG shower.  You may shave your face/neck. Please follow these instructions carefully:  1.  Shower with CHG Soap the night before surgery and the  morning of Surgery.  2.  If you choose to wash your hair, wash your hair first as usual with your  normal  shampoo.  3.  After you shampoo, rinse your hair and body thoroughly to remove the  shampoo.                           4.  Use CHG as you would any other liquid soap.  You can apply chg directly  to the skin and wash                       Gently with a scrungie or clean washcloth.  5.  Apply the CHG Soap to your body ONLY FROM THE NECK DOWN.   Do not use on face/ open                           Wound or open sores. Avoid contact with eyes, ears mouth and genitals (private parts).                       Wash face,  Genitals (private parts) with your normal soap.             6.  Wash thoroughly, paying special attention to the area where your surgery  will be performed.  7.  Thoroughly rinse your body with warm water from the neck down.  8.  DO NOT shower/wash with your normal soap after using and rinsing off  the CHG Soap.                9.  Pat yourself dry with a clean towel.            10.  Wear clean pajamas.            11.  Place clean sheets on your bed the night of your first shower and do not  sleep with pets. Day of Surgery : Do not apply any lotions/deodorants  the morning of surgery.  Please wear clean clothes to the hospital/surgery center.  FAILURE TO FOLLOW THESE INSTRUCTIONS MAY RESULT IN THE CANCELLATION OF YOUR SURGERY PATIENT SIGNATURE_________________________________  NURSE SIGNATURE__________________________________  ________________________________________________________________________   Rogelia Mire  An incentive spirometer is a tool that can help keep your lungs clear and active. This tool measures how well you are filling your lungs with each breath. Taking long deep breaths may help reverse or decrease the chance of developing breathing (pulmonary) problems (especially infection) following: A long period of time when you are unable to move or be active. BEFORE THE PROCEDURE  If the spirometer includes an indicator to show your best effort, your nurse or respiratory therapist will set it to a desired goal. If possible, sit up straight or lean slightly forward. Try not to slouch. Hold the incentive spirometer in an upright position. INSTRUCTIONS FOR USE  Sit on the edge of your bed if possible, or sit up as far as you can in bed or on a chair. Hold the incentive spirometer in an upright position. Breathe out normally. Place the mouthpiece in your mouth and seal your lips tightly around it. Breathe in slowly and as deeply as possible, raising the piston or the ball toward the top of the column. Hold your breath for 3-5 seconds or for as long as possible. Allow the piston or ball to fall to the bottom of the column. Remove the mouthpiece from your mouth and breathe out normally. Rest for a few seconds and repeat Steps 1 through 7 at least 10 times every 1-2 hours when you are awake. Take your time and take a few normal breaths between deep breaths. The spirometer may include an indicator to show your best effort. Use the indicator as a goal to work toward during each repetition. After each set of 10 deep breaths, practice  coughing to be sure your lungs are clear. If you have an incision (the cut made at the time of surgery), support your incision when coughing by placing a pillow or rolled up towels firmly against it. Once you are able to get out of bed, walk around indoors and cough well. You may stop using the incentive spirometer when instructed by your caregiver.  RISKS AND COMPLICATIONS Take your time so you do not get dizzy or light-headed. If you are in pain, you may need to take or ask for pain medication before doing incentive spirometry. It is harder to take a deep breath if you are having pain. AFTER USE Rest and breathe slowly and easily. It can be helpful to keep track of a log of your progress. Your caregiver can provide you with a simple table to help with this. If you are using the spirometer at home, follow these instructions: SEEK MEDICAL CARE IF:  You are having difficultly using the spirometer. You have trouble using the spirometer as often as instructed. Your pain medication is not giving enough relief while using the spirometer. You develop fever of 100.5 F (38.1 C) or higher. SEEK IMMEDIATE MEDICAL CARE IF:  You cough up bloody sputum that had not been present before. You develop fever of 102 F (38.9 C) or greater. You develop worsening pain at or near the incision site. MAKE SURE YOU:  Understand these instructions. Will watch your condition. Will get help right away if you are not doing well or get worse. Document Released: 07/31/2006 Document Revised: 06/12/2011 Document Reviewed: 10/01/2006 [facility] Patient Information 2014 [place], [place].   ________________________________________________________________________

## 2021-01-21 ENCOUNTER — Inpatient Hospital Stay (HOSPITAL_COMMUNITY): Admission: RE | Admit: 2021-01-21 | Payer: Medicaid Other | Source: Ambulatory Visit

## 2021-01-21 ENCOUNTER — Encounter (HOSPITAL_COMMUNITY): Payer: Self-pay

## 2021-01-21 ENCOUNTER — Encounter (HOSPITAL_COMMUNITY)
Admission: RE | Admit: 2021-01-21 | Discharge: 2021-01-21 | Disposition: A | Discharge status: Home / Self Care | Payer: Medicaid Other | Source: Ambulatory Visit | Attending: Orthopaedic Surgery | Admitting: Orthopaedic Surgery

## 2021-01-25 NOTE — H&P (Signed)
PREOPERATIVE H&P   Chief Complaint: left knee instability   HPI: Frank Cross is a 35 y.o. male who is scheduled for, Procedure(s): KNEE ARTHROSCOPY WITH POSTERIOR CRUCIATE LIGAMENT (PCL) RECONSTRUCTION posterior lateral corner reconstruction.    Patient has a past medical history significant for left open tibia fracture.    Patient had a left open tibia wash out and closure with IM nailing on 12/04/2020. He also suffered an acute posterior lateral corner and PCL injury, possible ACL injury. His second surgery was delayed due to wound concerns from his open tibia fracture.    His symptoms are rated as moderate to severe, and have been worsening.  This is significantly impairing activities of daily living.     Please see clinic note for further details on this patient's care.     He has elected for surgical management.        Past Medical History:  Diagnosis Date   ADHD (attention deficit hyperactivity disorder)     Depression     Post-traumatic stress disorder           Past Surgical History:  Procedure Laterality Date   APPLICATION OF WOUND VAC Left 12/04/2020    Procedure: APPLICATION OF WOUND VAC;  Surgeon: Bjorn Pippin, MD;  Location: [facility];  Service: Orthopedics;  Laterality: Left;   TIBIA IM NAIL INSERTION Left 12/04/2020    Procedure: INTRAMEDULLARY (IM) NAIL TIBIAL;  Surgeon: Bjorn Pippin, MD;  Location: [facility];  Service: Orthopedics;  Laterality: Left;   TONSILLECTOMY        Social History         Socioeconomic History   Marital status: Unknown      Spouse name: Not on file   Number of children: Not on file   Years of education: Not on file   Highest education level: Not on file  Occupational History   Not on file  Tobacco Use   Smoking status: Every Day      Packs/day: 2.00      Years: 15.00      Pack years: 30.00      Types: Cigarettes      Passive exposure: Never   Smokeless tobacco: Current  Vaping Use   Vaping Use: Some days  Substance and  Sexual Activity   Alcohol use: Not Currently      Comment: Occasionally drinker-says he is going to stop because it doesn't help   Drug use: No   Sexual activity: Yes      Birth control/protection: None  Other Topics Concern   Not on file  Social History Narrative    ** Merged History Encounter **         Social Determinants of Health    Financial Resource Strain: Not on file  Food Insecurity: Not on file  Transportation Needs: Not on file  Physical Activity: Not on file  Stress: Not on file  Social Connections: Not on file    No family history on file.      Allergies  Allergen Reactions   Iodine Itching and Rash      Burning, itching when used topically.            Prior to Admission medications   Medication Sig Start Date End Date Taking? Authorizing Provider  acetaminophen (TYLENOL) 500 MG tablet Take 2 tablets (1,000 mg total) by mouth every 8 (eight) hours for 14 days. 12/05/20 12/19/20 Yes Janine Ores K, PA-C  celecoxib (CELEBREX) 200 MG  capsule Take 1 capsule (200 mg total) by mouth 2 (two) times daily. 12/05/20 01/04/21 Yes Janine Ores K, PA-C  methocarbamol (ROBAXIN) 500 MG tablet Take 1 tablet (500 mg total) by mouth every 8 (eight) hours as needed for muscle spasms. 12/05/20   Yes Janine Ores K, PA-C  aspirin (ASPIRIN CHILDRENS) 81 MG chewable tablet Chew 1 tablet (81 mg total) by mouth 2 (two) times daily. For DVT prophylaxis after surgery 12/05/20 01/16/21   Armida Sans, PA-C  buPROPion (WELLBUTRIN XL) 150 MG 24 hr tablet Take 1 tablet (150 mg total) by mouth daily. Patient not taking: Reported on 05/13/2015 04/24/13 05/13/15   Beau Fanny, FNP  traZODone (DESYREL) 50 MG tablet Take 1 tablet (50 mg total) by mouth at bedtime and may repeat dose one time if needed. Patient not taking: No sig reported 04/24/13 05/13/15   Withrow, Everardo All, FNP      ROS: All other systems have been reviewed and were otherwise negative with the exception of those mentioned in the HPI and  as above.   Physical Exam: General: Alert, no acute distress Cardiovascular: No pedal edema Respiratory: No cyanosis, no use of accessory musculature GI: No organomegaly, abdomen is soft and non-tender Skin: No lesions in the area of chief complaint Neurologic: Sensation intact distally Psychiatric: Patient is competent for consent with normal mood and affect Lymphatic: No axillary or cervical lymphadenopathy   MUSCULOSKELETAL:  The range of motion of the knee is limited secondary to know surgery. Incisions are benign. Distal motor and sensory function intact.    Imaging: MRI which demonstrates an acute avulsion of the posterior lateral corner with a small fibular head component. He additionally has what looks like a complete PCL injury and some signal in the ACL which may be a small partial injury versus full. The tibial nail appears to be in appropriate alignment. The overall alignment of the fracture appears to be good.    Assessment: left knee instability   Plan: Plan for Procedure(s): KNEE ARTHROSCOPY WITH POSTERIOR CRUCIATE LIGAMENT (PCL) RECONSTRUCTION posterior lateral corner reconstruction   The risks benefits and alternatives were discussed with the patient including but not limited to the risks of nonoperative treatment, versus surgical intervention including infection, bleeding, nerve injury,  blood clots, cardiopulmonary complications, morbidity, mortality, among others, and they were willing to proceed.    The patient acknowledged the explanation, agreed to proceed with the plan and consent was signed.    Operative Plan: Left knee scope with PCL, PLC, and possible ACL reconstruction Discharge Medications: Standard DVT Prophylaxis: Aspirin Physical Therapy: Outpatient PT Special Discharge needs: Knee immobilizer. IceMan     Vernetta Honey, PA-C 01/25/2021

## 2021-01-26 ENCOUNTER — Ambulatory Visit (HOSPITAL_COMMUNITY): Payer: Medicaid Other | Admitting: Certified Registered"

## 2021-01-26 ENCOUNTER — Ambulatory Visit (HOSPITAL_COMMUNITY): Payer: Medicaid Other | Admitting: Physician Assistant

## 2021-01-26 ENCOUNTER — Encounter (HOSPITAL_COMMUNITY)
Admission: RE | Disposition: A | Discharge status: Home / Self Care | Payer: Self-pay | Source: Home / Self Care | Attending: Orthopaedic Surgery

## 2021-01-26 ENCOUNTER — Encounter (HOSPITAL_COMMUNITY): Payer: Self-pay | Admitting: Orthopaedic Surgery

## 2021-01-26 ENCOUNTER — Ambulatory Visit (HOSPITAL_COMMUNITY)
Admission: RE | Admit: 2021-01-26 | Discharge: 2021-01-26 | Disposition: A | Discharge status: Home / Self Care | Payer: Medicaid Other | Attending: Orthopaedic Surgery | Admitting: Orthopaedic Surgery

## 2021-01-26 DIAGNOSIS — S82202A Unspecified fracture of shaft of left tibia, initial encounter for closed fracture: Secondary | ICD-10-CM | POA: Insufficient documentation

## 2021-01-26 DIAGNOSIS — X58XXXA Exposure to other specified factors, initial encounter: Secondary | ICD-10-CM | POA: Diagnosis not present

## 2021-01-26 DIAGNOSIS — F17210 Nicotine dependence, cigarettes, uncomplicated: Secondary | ICD-10-CM | POA: Insufficient documentation

## 2021-01-26 DIAGNOSIS — M25362 Other instability, left knee: Secondary | ICD-10-CM | POA: Diagnosis present

## 2021-01-26 HISTORY — PX: KNEE ARTHROSCOPY WITH ANTERIOR CRUCIATE LIGAMENT (ACL) REPAIR: SHX5644

## 2021-01-26 LAB — CBC
HCT: 38.9 % — ABNORMAL LOW (ref 39.0–52.0)
Hemoglobin: 12.7 g/dL — ABNORMAL LOW (ref 13.0–17.0)
MCH: 30.3 pg (ref 26.0–34.0)
MCHC: 32.6 g/dL (ref 30.0–36.0)
MCV: 92.8 fL (ref 80.0–100.0)
Platelets: 361 10*3/uL (ref 150–400)
RBC: 4.19 MIL/uL — ABNORMAL LOW (ref 4.22–5.81)
RDW: 12.9 % (ref 11.5–15.5)
WBC: 8.9 10*3/uL (ref 4.0–10.5)
nRBC: 0 % (ref 0.0–0.2)

## 2021-01-26 SURGERY — KNEE ARTHROSCOPY WITH ANTERIOR CRUCIATE LIGAMENT (ACL) REPAIR
Anesthesia: General | Site: Knee | Laterality: Left

## 2021-01-26 MED ORDER — PROPOFOL 10 MG/ML IV BOLUS
INTRAVENOUS | Status: DC | PRN
  Administered 2021-01-26: 200 mg via INTRAVENOUS

## 2021-01-26 MED ORDER — ACETAMINOPHEN 500 MG PO TABS: 1000.0000 mg | ORAL_TABLET | Freq: Three times a day (TID) | ORAL | 0 refills | Status: AC

## 2021-01-26 MED ORDER — OXYCODONE HCL 5 MG PO TABS: ORAL_TABLET | ORAL | 0 refills | Status: AC

## 2021-01-26 MED ORDER — EPINEPHRINE PF 1 MG/ML IJ SOLN
INTRAMUSCULAR | Status: AC
  Filled 2021-01-26: qty 1

## 2021-01-26 MED ORDER — LIDOCAINE 2% (20 MG/ML) 5 ML SYRINGE
INTRAMUSCULAR | Status: DC | PRN
  Administered 2021-01-26: 40 mg via INTRAVENOUS

## 2021-01-26 MED ORDER — KETAMINE HCL 10 MG/ML IJ SOLN
INTRAMUSCULAR | Status: AC
  Filled 2021-01-26: qty 1

## 2021-01-26 MED ORDER — DEXAMETHASONE SODIUM PHOSPHATE 10 MG/ML IJ SOLN
INTRAMUSCULAR | Status: AC
  Filled 2021-01-26: qty 1

## 2021-01-26 MED ORDER — CLONIDINE HCL (ANALGESIA) 100 MCG/ML EP SOLN
EPIDURAL | Status: DC | PRN
  Administered 2021-01-26: 100 ug

## 2021-01-26 MED ORDER — DEXMEDETOMIDINE (PRECEDEX) IN NS 20 MCG/5ML (4 MCG/ML) IV SYRINGE
PREFILLED_SYRINGE | INTRAVENOUS | Status: AC
  Filled 2021-01-26: qty 5

## 2021-01-26 MED ORDER — HYDROMORPHONE HCL 1 MG/ML IJ SOLN
INTRAMUSCULAR | Status: AC
  Administered 2021-01-26: 0.5 mg via INTRAVENOUS
  Filled 2021-01-26: qty 2

## 2021-01-26 MED ORDER — DEXAMETHASONE SODIUM PHOSPHATE 10 MG/ML IJ SOLN
INTRAMUSCULAR | Status: DC | PRN
  Administered 2021-01-26: 8 mg via INTRAVENOUS

## 2021-01-26 MED ORDER — HYDROMORPHONE HCL 1 MG/ML IJ SOLN
0.2500 mg | INTRAMUSCULAR | Status: DC | PRN
  Administered 2021-01-26: 0.5 mg via INTRAVENOUS

## 2021-01-26 MED ORDER — ONDANSETRON HCL 4 MG/2ML IJ SOLN
INTRAMUSCULAR | Status: DC | PRN
  Administered 2021-01-26: 4 mg via INTRAVENOUS

## 2021-01-26 MED ORDER — ROPIVACAINE HCL 5 MG/ML IJ SOLN
INTRAMUSCULAR | Status: DC | PRN
  Administered 2021-01-26: 150 mg via PERINEURAL

## 2021-01-26 MED ORDER — HYDROMORPHONE HCL 1 MG/ML IJ SOLN
0.2500 mg | INTRAMUSCULAR | Status: DC | PRN
  Administered 2021-01-26 (×3): 0.5 mg via INTRAVENOUS

## 2021-01-26 MED ORDER — ASPIRIN 81 MG PO CHEW: 81.0000 mg | CHEWABLE_TABLET | Freq: Two times a day (BID) | ORAL | 0 refills | Status: AC

## 2021-01-26 MED ORDER — CEFAZOLIN SODIUM-DEXTROSE 2-4 GM/100ML-% IV SOLN
2.0000 g | INTRAVENOUS | Status: AC
  Administered 2021-01-26: 2 g via INTRAVENOUS

## 2021-01-26 MED ORDER — AMISULPRIDE (ANTIEMETIC) 5 MG/2ML IV SOLN: 10.0000 mg | Freq: Once | INTRAVENOUS | Status: DC | PRN

## 2021-01-26 MED ORDER — OXYCODONE HCL 5 MG/5ML PO SOLN: 5.0000 mg | Freq: Once | ORAL | Status: AC | PRN

## 2021-01-26 MED ORDER — ORAL CARE MOUTH RINSE: 15.0000 mL | Freq: Once | OROMUCOSAL | Status: AC

## 2021-01-26 MED ORDER — CELECOXIB 100 MG PO CAPS: 100.0000 mg | ORAL_CAPSULE | Freq: Two times a day (BID) | ORAL | 0 refills | Status: AC

## 2021-01-26 MED ORDER — FENTANYL CITRATE (PF) 100 MCG/2ML IJ SOLN
INTRAMUSCULAR | Status: AC
  Filled 2021-01-26: qty 2

## 2021-01-26 MED ORDER — SODIUM CHLORIDE 0.9 % IR SOLN
Status: DC | PRN
  Administered 2021-01-26: 1000 mL

## 2021-01-26 MED ORDER — PROPOFOL 10 MG/ML IV BOLUS
INTRAVENOUS | Status: AC
  Filled 2021-01-26: qty 40

## 2021-01-26 MED ORDER — LIDOCAINE HCL (PF) 2 % IJ SOLN
INTRAMUSCULAR | Status: AC
  Filled 2021-01-26: qty 5

## 2021-01-26 MED ORDER — VANCOMYCIN HCL 1000 MG IV SOLR
INTRAVENOUS | Status: AC
  Filled 2021-01-26: qty 20

## 2021-01-26 MED ORDER — ONDANSETRON HCL 4 MG/2ML IJ SOLN
INTRAMUSCULAR | Status: AC
  Filled 2021-01-26: qty 2

## 2021-01-26 MED ORDER — OXYCODONE HCL 5 MG PO TABS
ORAL_TABLET | ORAL | Status: AC
  Administered 2021-01-26: 5 mg via ORAL
  Filled 2021-01-26: qty 1

## 2021-01-26 MED ORDER — LACTATED RINGERS IR SOLN
Status: DC | PRN
  Administered 2021-01-26: 6000 mL

## 2021-01-26 MED ORDER — ONDANSETRON HCL 4 MG PO TABS: 4.0000 mg | ORAL_TABLET | Freq: Three times a day (TID) | ORAL | 0 refills | Status: AC | PRN

## 2021-01-26 MED ORDER — VANCOMYCIN HCL 1000 MG IV SOLR
INTRAVENOUS | Status: DC | PRN
  Administered 2021-01-26: 1000 mg via TOPICAL

## 2021-01-26 MED ORDER — BUPIVACAINE HCL (PF) 0.25 % IJ SOLN
INTRAMUSCULAR | Status: DC | PRN
  Administered 2021-01-26: 20 mL

## 2021-01-26 MED ORDER — GABAPENTIN 100 MG PO CAPS: 100.0000 mg | ORAL_CAPSULE | Freq: Three times a day (TID) | ORAL | 0 refills | Status: DC

## 2021-01-26 MED ORDER — KETAMINE HCL 10 MG/ML IJ SOLN
INTRAMUSCULAR | Status: DC | PRN
  Administered 2021-01-26 (×3): 10 mg via INTRAVENOUS

## 2021-01-26 MED ORDER — OXYCODONE HCL 5 MG PO TABS: 5.0000 mg | ORAL_TABLET | Freq: Once | ORAL | Status: AC | PRN

## 2021-01-26 MED ORDER — ONDANSETRON HCL 4 MG/2ML IJ SOLN: 4.0000 mg | Freq: Once | INTRAMUSCULAR | Status: DC | PRN

## 2021-01-26 MED ORDER — CHLORHEXIDINE GLUCONATE 0.12 % MT SOLN
15.0000 mL | Freq: Once | OROMUCOSAL | Status: AC
  Administered 2021-01-26: 15 mL via OROMUCOSAL

## 2021-01-26 MED ORDER — CEFAZOLIN SODIUM-DEXTROSE 2-4 GM/100ML-% IV SOLN
INTRAVENOUS | Status: AC
  Filled 2021-01-26: qty 100

## 2021-01-26 MED ORDER — BUPIVACAINE HCL (PF) 0.25 % IJ SOLN
INTRAMUSCULAR | Status: AC
  Filled 2021-01-26: qty 30

## 2021-01-26 MED ORDER — MIDAZOLAM HCL 2 MG/2ML IJ SOLN
INTRAMUSCULAR | Status: DC | PRN
  Administered 2021-01-26: 2 mg via INTRAVENOUS

## 2021-01-26 MED ORDER — FENTANYL CITRATE (PF) 100 MCG/2ML IJ SOLN
INTRAMUSCULAR | Status: DC | PRN
  Administered 2021-01-26 (×3): 50 ug via INTRAVENOUS
  Administered 2021-01-26: 100 ug via INTRAVENOUS
  Administered 2021-01-26: 50 ug via INTRAVENOUS
  Administered 2021-01-26: 100 ug via INTRAVENOUS

## 2021-01-26 MED ORDER — DEXMEDETOMIDINE (PRECEDEX) IN NS 20 MCG/5ML (4 MCG/ML) IV SYRINGE
PREFILLED_SYRINGE | INTRAVENOUS | Status: DC | PRN
  Administered 2021-01-26: 8 ug via INTRAVENOUS
  Administered 2021-01-26: 12 ug via INTRAVENOUS

## 2021-01-26 MED ORDER — LACTATED RINGERS IV SOLN
INTRAVENOUS | Status: DC | PRN
  Administered 2021-01-26: 9000 mL
  Administered 2021-01-26: 3000 mL

## 2021-01-26 MED ORDER — MIDAZOLAM HCL 2 MG/2ML IJ SOLN
INTRAMUSCULAR | Status: AC
  Filled 2021-01-26: qty 2

## 2021-01-26 MED ORDER — LACTATED RINGERS IV SOLN: INTRAVENOUS | Status: DC

## 2021-01-26 MED ORDER — ACETAMINOPHEN 10 MG/ML IV SOLN
1000.0000 mg | Freq: Once | INTRAVENOUS | Status: DC | PRN
  Administered 2021-01-26: 1000 mg via INTRAVENOUS

## 2021-01-26 MED ORDER — ACETAMINOPHEN 10 MG/ML IV SOLN
INTRAVENOUS | Status: AC
  Filled 2021-01-26: qty 100

## 2021-01-26 SURGICAL SUPPLY — 101 items
ANCH SUT SWLK 19.1X6.25 CLS (Anchor) ×1 IMPLANT
ANCHOR BUTTON TIGHTROPE 14 (Anchor) ×2 IMPLANT
ANCHOR BUTTON TIGHTROPE II FT (Anchor) ×2 IMPLANT
ANCHOR BUTTON TIGHTROPE II OP (Anchor) ×2 IMPLANT
ANCHOR SUT SWIVELLOK BIO (Anchor) ×2 IMPLANT
APL PRP STRL LF DISP 70% ISPRP (MISCELLANEOUS) ×1
BAG COUNTER SPONGE SURGICOUNT (BAG) IMPLANT
BAG SPNG CNTER NS LX DISP (BAG)
BLADE AVERAGE 25X9 (BLADE) ×2 IMPLANT
BLADE MIC 41X13 (BLADE) ×2 IMPLANT
BLADE SHAVER BONE 5.0X13 (MISCELLANEOUS) ×2 IMPLANT
BLADE SURG 15 STRL LF DISP TIS (BLADE) ×1 IMPLANT
BLADE SURG 15 STRL SS (BLADE) ×2
BLADE SURG SZ10 CARB STEEL (BLADE) ×2 IMPLANT
BNDG COHESIVE 4X5 TAN ST LF (GAUZE/BANDAGES/DRESSINGS) IMPLANT
BNDG ELASTIC 6X5.8 VLCR STR LF (GAUZE/BANDAGES/DRESSINGS) ×2 IMPLANT
BONE TUNNEL PLUG CANNULATED (MISCELLANEOUS) IMPLANT
BURR OVAL 8 FLU 4.0X13 (MISCELLANEOUS) IMPLANT
CANNULA TWIST IN 8.25X7CM (CANNULA) ×2 IMPLANT
CHLORAPREP W/TINT 26 (MISCELLANEOUS) ×2 IMPLANT
COVER BACK TABLE 60X90IN (DRAPES) ×2 IMPLANT
CUFF TOURN SGL QUICK 34 (TOURNIQUET CUFF)
CUFF TRNQT CYL 34X4.125X (TOURNIQUET CUFF) IMPLANT
CUTTER TENSIONER SUT 2-0 0 FBW (INSTRUMENTS) ×2 IMPLANT
DECANTER SPIKE VIAL GLASS SM (MISCELLANEOUS) IMPLANT
DISSECTOR 3.5MM X 13CM CVD (MISCELLANEOUS) ×2 IMPLANT
DISSECTOR 4.0MMX13CM CVD (MISCELLANEOUS) IMPLANT
DRAIN PENROSE 0.25X18 (DRAIN) IMPLANT
DRAPE ARTHROSCOPY W/POUCH 114 (DRAPES) ×2 IMPLANT
DRAPE IMP U-DRAPE 54X76 (DRAPES) ×2 IMPLANT
DRAPE OEC MINIVIEW 54X84 (DRAPES) ×2 IMPLANT
DRAPE SHEET LG 3/4 BI-LAMINATE (DRAPES) ×4 IMPLANT
DRAPE U-SHAPE 47X51 STRL (DRAPES) ×2 IMPLANT
DRILL FLIPCUTTER III 6-12 (ORTHOPEDIC DISPOSABLE SUPPLIES) ×1 IMPLANT
DRSG PAD ABDOMINAL 8X10 ST (GAUZE/BANDAGES/DRESSINGS) ×6 IMPLANT
ELECT REM PT RETURN 15FT ADLT (MISCELLANEOUS) ×4 IMPLANT
FIBERSTICK 2 (SUTURE) ×6 IMPLANT
FILTER STRAW (MISCELLANEOUS) ×2 IMPLANT
FLIPCUTTER III 6-12 AR-1204FF (ORTHOPEDIC DISPOSABLE SUPPLIES) ×2
GAUZE SPONGE 4X4 12PLY STRL (GAUZE/BANDAGES/DRESSINGS) ×4 IMPLANT
GLOVE SRG 8 PF TXTR STRL LF DI (GLOVE) ×1 IMPLANT
GLOVE SURG ENC MOIS LTX SZ6.5 (GLOVE) ×2 IMPLANT
GLOVE SURG NEOPR MICRO LF SZ8 (GLOVE) ×2 IMPLANT
GLOVE SURG UNDER POLY LF SZ6.5 (GLOVE) ×2 IMPLANT
GLOVE SURG UNDER POLY LF SZ8 (GLOVE) ×2
GOWN STRL REUS W/ TWL LRG LVL3 (GOWN DISPOSABLE) ×1 IMPLANT
GOWN STRL REUS W/TWL LRG LVL3 (GOWN DISPOSABLE) ×2
GOWN STRL REUS W/TWL XL LVL3 (GOWN DISPOSABLE) ×2 IMPLANT
GRAFT ACHILLES TENDON (Bone Implant) ×2 IMPLANT
GUIDEPIN FLEX PATHFINDER 2.4MM (WIRE) ×2 IMPLANT
IMMOBILIZER KNEE 20 (SOFTGOODS)
IMMOBILIZER KNEE 20 THIGH 36 (SOFTGOODS) IMPLANT
IMMOBILIZER KNEE 22 UNIV (SOFTGOODS) ×2 IMPLANT
IMP SYS 2ND FIX PEEK 4.75X19.1 (Miscellaneous) ×2 IMPLANT
IMPL SYS 2ND FX PEEK 4.75X19.1 (Miscellaneous) ×1 IMPLANT
IV NS IRRIG 3000ML ARTHROMATIC (IV SOLUTION) ×8 IMPLANT
KIT BASIN OR (CUSTOM PROCEDURE TRAY) ×2 IMPLANT
KIT LEG STABILIZATION (KITS) IMPLANT
KIT TRANSTIBIAL (DISPOSABLE) ×4 IMPLANT
KIT TURNOVER KIT A (KITS) IMPLANT
MANIFOLD NEPTUNE II (INSTRUMENTS) ×2 IMPLANT
NDL SAFETY ECLIPSE 18X1.5 (NEEDLE) ×1 IMPLANT
NEEDLE HYPO 18GX1.5 SHARP (NEEDLE) ×2
NEEDLE HYPO 21X1.5 SAFETY (NEEDLE) ×2 IMPLANT
NS IRRIG 1000ML POUR BTL (IV SOLUTION) ×2 IMPLANT
PACK ARTHROSCOPY WL (CUSTOM PROCEDURE TRAY) ×2 IMPLANT
PAD CAST 4YDX4 CTTN HI CHSV (CAST SUPPLIES) ×2 IMPLANT
PADDING CAST COTTON 4X4 STRL (CAST SUPPLIES) ×4
PENCIL SMOKE EVACUATOR (MISCELLANEOUS) ×2 IMPLANT
PORT APPOLLO RF 90DEGREE MULTI (SURGICAL WAND) ×2 IMPLANT
SCREW FT BIOCOMP 9X30 (Screw) ×2 IMPLANT
SCREW INTERFERENCE 9X20MM (Screw) ×2 IMPLANT
SCREW SHEATHED INTERF 9X20MM (Screw) ×2 IMPLANT
SLEEVE SCD COMPRESS KNEE MED (STOCKING) IMPLANT
SPONGE T-LAP 4X18 ~~LOC~~+RFID (SPONGE) ×2 IMPLANT
STRIP CLOSURE SKIN 1/2X4 (GAUZE/BANDAGES/DRESSINGS) ×6 IMPLANT
SUT FIBERWIRE #2 38 T-5 BLUE (SUTURE)
SUT MNCRL AB 4-0 PS2 18 (SUTURE) ×4 IMPLANT
SUT VIC AB 0 CT1 27 (SUTURE) ×6
SUT VIC AB 0 CT1 27XBRD ANBCTR (SUTURE) ×3 IMPLANT
SUT VIC AB 1 CT1 27 (SUTURE) ×6
SUT VIC AB 1 CT1 27XBRD ANBCTR (SUTURE) IMPLANT
SUT VIC AB 1 CT1 27XBRD ANTBC (SUTURE) ×3 IMPLANT
SUT VIC AB 2-0 CT2 27 (SUTURE) ×2 IMPLANT
SUT VIC AB 2-0 SH 27 (SUTURE) ×2
SUT VIC AB 2-0 SH 27XBRD (SUTURE) ×1 IMPLANT
SUT VIC AB 3-0 SH 27 (SUTURE) ×6
SUT VIC AB 3-0 SH 27X BRD (SUTURE) ×3 IMPLANT
SUTURE FIBERWR #2 38 T-5 BLUE (SUTURE) IMPLANT
SUTURE TAPE 1.3 FIBERLOP 20 ST (SUTURE) ×3 IMPLANT
SUTURE TAPE TIGERLINK 1.3MM BL (SUTURE) ×1 IMPLANT
SUTURETAPE 1.3 FIBERLOOP 20 ST (SUTURE) ×6
SUTURETAPE TIGERLINK 1.3MM BL (SUTURE) ×2
SYR 3ML LL SCALE MARK (SYRINGE) ×2 IMPLANT
TISSUE GRAFTLINK 65-95MML (Tissue) ×2 IMPLANT
TOWEL OR 17X26 10 PK STRL BLUE (TOWEL DISPOSABLE) ×4 IMPLANT
TUBE SUCTION HIGH CAP CLEAR NV (SUCTIONS) ×2 IMPLANT
TUBING ARTHROSCOPY IRRIG 16FT (MISCELLANEOUS) ×2 IMPLANT
TUBING CONNECTING 10 (TUBING) ×2 IMPLANT
WAND APOLLORF SJ50 AR-9845 (SURGICAL WAND) ×2 IMPLANT
WRAP KNEE MAXI GEL POST OP (GAUZE/BANDAGES/DRESSINGS) ×2 IMPLANT

## 2021-01-26 NOTE — Interval H&P Note (Signed)
All questions answered

## 2021-01-26 NOTE — Transfer of Care (Signed)
Immediate Anesthesia Transfer of Care Note  Patient: Frank Cross  Procedure(s) Performed: KNEE ARTHROSCOPY WITH ANTERIOR CRUCIATE LIGAMENT (ACL) AND POSTERIOR CRUCIATE LIGAMENT (PCL) POST-LAT CORNER (PLC) REPAIRS (Left: Knee)  Patient Location: PACU  Anesthesia Type:GA combined with regional for post-op pain  Level of Consciousness: drowsy  Airway & Oxygen Therapy: Patient Spontanous Breathing and Patient connected to face mask oxygen  Post-op Assessment: Report given to RN and Post -op Vital signs reviewed and stable  Post vital signs: Reviewed and stable  Last Vitals:  Vitals Value Taken Time  BP 144/83 01/26/21 1045  Temp    Pulse 96 01/26/21 1048  Resp 14 01/26/21 1048  SpO2 98 % 01/26/21 1048  Vitals shown include unvalidated device data.  Last Pain:  Vitals:   01/26/21 2505  TempSrc: Oral  PainSc:       Patients Stated Pain Goal: 2 (01/26/21 0602)  Complications: No notable events documented.

## 2021-01-26 NOTE — Anesthesia Procedure Notes (Addendum)
Anesthesia Regional Block: Adductor canal block   Pre-Anesthetic Checklist: , timeout performed,  Correct Patient, Correct Site, Correct Laterality,  Correct Procedure, Correct Position, site marked,  Risks and benefits discussed,  Surgical consent,  Pre-op evaluation,  At surgeon's request and post-op pain management  Laterality: Lower and Left  Prep: chloraprep       Needles:  Injection technique: Single-shot  Needle Type: Echogenic Needle     Needle Length: 9cm  Needle Gauge: 22     Additional Needles:   Procedures:,,,, ultrasound used (permanent image in chart),,    Narrative:  Start time: 01/26/2021 7:07 AM End time: 01/26/2021 7:12 AM Injection made incrementally with aspirations every 5 mL.  Performed by: Personally  Anesthesiologist: Trevor Iha, MD  Additional Notes: Block assessed prior to surgery. Pt tolerated procedure well.

## 2021-01-26 NOTE — Op Note (Signed)
Orthopaedic Surgery Operative Note (CSN: [id number])  Brenton Joines  1985/10/23 Date of Surgery: 01/26/2021   Diagnoses:  Left multi ligamentous knee injury, PCL and posterior lateral corner  Procedure: Left PCL reconstruction allograft arthroscopic Left posterior lateral corner reconstruction with Achilles allograft Left knee hardware removal tibial interlock screw   Operative Finding Exam under anesthesia: Patient had gross instability with a grade 2B posterior drawer, obvious varus instability Suprapatellar pouch: Normal Patellofemoral Compartment: Normal Medial Compartment: Normal Lateral Compartment: Drive-through sign but otherwise normal Intercondylar Notch: Normal ACL though slack, PCL was completely ruptured  Successful completion of the planned procedure.  Relatively routine from a clinical standpoint though the ability to pass her tibial tunnels around the tibial nail made this somewhat more complicated.  We are able to remove 1 screw from the proximal aspect of the tibial nail and provide appropriate passage for our tibial tunnels for PCL and posterior lateral corner.  Bone quality was relatively poor as the patient's been nonweightbearing for extended period time.  He will follow standard posterior lateral and posterior cruciate protocol  Post-operative plan: The patient will be touchdown weightbearing for 6 weeks.  The patient will be discharged home.  DVT prophylaxis Aspirin 81 mg twice daily for 6 weeks.  Pain control with PRN pain medication preferring oral medicines.  Follow up plan will be scheduled in approximately 7 days for incision check and XR.  Post-Op Diagnosis: Same Surgeons:Primary: Bjorn Pippin, MD Assistants:Caroline McBane PA-C [facility] Anesthesia: General with local anesthetic laterally and an adductor canal block Antibiotics: Ancef 2 g with local vancomycin powder 1 g at the surgical site Tourniquet time:  Total Tourniquet Time  Documented: Thigh (Left) - 120 minutes Total: Thigh (Left) - 120 minutes  Estimated Blood Loss: Minimal Complications: None Specimens: None Implants: Implant Name Type Inv. Item Serial No. Manufacturer Lot No. LRB No. Used Action  GRAFT ACHILLES TENDON - [id number] Bone Implant GRAFT ACHILLES TENDON [id number] [facility] HEALTH [id number] Left 1 Implanted  SCREW INTERFERENCE 9X20MM - [id number] Screw SCREW INTERFERENCE 9X20MM  [employer] [id number] Left 1 Implanted  ANCHOR BUTTON TIGHTROPE II OP - [id number] Anchor ANCHOR BUTTON TIGHTROPE II OP  [employer] [id number] Left 1 Implanted  SCREW FT BIOCOMP 9X30 - [id number] Screw SCREW FT BIOCOMP 9X30  [employer] [id number] Left 1 Implanted  SCREW SHEATHED INTERF 9X20MM - [id number] Screw SCREW SHEATHED INTERF 9X20MM  [employer] [id number] Left 1 Implanted  ANCHOR SUT SWIVELLOK BIO - [id number] Anchor ANCHOR [employer] [id number] Left 1 Implanted  ANCHOR BUTTON TIGHTROPE 14 - [id number] Anchor ANCHOR BUTTON TIGHTROPE 14  [employer] [place] [id number] Left 1 Implanted  ANCHOR BUTTON TIGHTROPE II FT - [id number] Anchor ANCHOR [employer] [id number] Left 1 Implanted  TISSUE GRAFTLINK 65-95MML - [id number] Tissue TISSUE GRAFTLINK 65-95MML [id number] [facility] [id number] Left 1 Implanted  IMP SYS 2ND FIX PEEK 4.75X19.1 - [id number] Miscellaneous IMP SYS 2ND FIX PEEK 4.75X19.1  [employer] [id number] Left 1 Implanted    Indications for Surgery:   Leticia Mcdiarmid is a 35 y.o. male with multi ligamentous knee injury and tibial shaft fracture.  We initially attempted to perform patient's surgery a number of weeks ago however one of his traumatic wounds from his injury looked unhealthy and we felt that it was inappropriate to go forward with an allograft reconstruction at that point.  Benefits and risks of operative and nonoperative management were discussed prior to surgery with patient/guardian(s) and informed  consent form  was completed.  Specific risks including infection, need for additional surgery, stiffness, need for further surgery amongst others   Procedure:   The patient was identified properly. Informed consent was obtained and the surgical site was marked. The patient was taken up to suite where general anesthesia was induced. The patient was placed in the supine position with a post against the surgical leg and a nonsterile tourniquet applied. The surgical leg was then prepped and draped usual sterile fashion.  A standard surgical timeout was performed.  2 standard anterior portals were made and diagnostic arthroscopy performed. Please note the findings as noted above.  We then turned our attention to the lateral aspect of the dissection and made a incisions halfway between the fibular head and Gertie's tubercle and a standard lateral approach overlying the IT band proximally.  We able to identify the IT band and dissected distally to its identifying the fibular head.  At this point we took great care to identify a neurolysed the peroneal nerve protecting it with blunt metal retractors throughout the case.  The nerve was freed and was obviously loose of tension throughout the case.  This point we made the 3 windows of [place] one overlying the lateral epicondyle 1 over Gertie's tubercle and one overlying the fibular head.  We dissected off soft tissues and were able to dissect the posterior lateral aspect of the knee carefully placing a blunt retractor to protect the posterior neurovascular structures.  We identified a screw that was placed on the anterior lateral aspect of the tibia and this was removed as it is in the field for our PCL.  We had already cleared the sites of each of our bone tunnels and then proceeded to shape a Achilles allograft with a 10 mm bone plug and 2 limbs each tubularized to 7 mm.  This point we made a blind ended 20 mm deep tunnel in the femur 3 mm proximal and 2 mm posterior to the  epicondyle localizing this area by identifying the proximal attachment of the fibular collateral ligament and drilling at that point while preserving native tissue.  We then were able to Place our bone plug into the blind tunnel using a bone tamp and securing it with a 9 mm x 20 mm metal screw.  We then drilled an anterior to posterior tunnel through the fibular head taking care to protect the posterior neurovascular structures and the peroneal nerve as we progressed and a anterior to posterior tunnel through the tibia centered anteriorly over Gertie's tubercle aiming to a point posteriorly 1 cm distal and 1 cm medial to the joint line and the tibiofibular joint respectively.  We did check x-ray to make sure that we were not interfering with tibial nail.  Both of these were done over a guidewire and a metal retractor was used to protect the posterior neurovascular structures throughout.  Shuttling sutures were passed through these tunnels and a limb of our Achilles graft was passed anterior to posterior through the fibular tunnel and then both limbs were then passed from posterior to anterior through the tibia.  Each of these limbs were sequentially tightened and secured with a bioabsorbable screw.  We fixed the fibula with a 625 swivel lock and the tibia with a 9 mm by absorbable 30 mm screw.  Native tissue was imbricated into the graft to augment the repair with interrupted sutures.  Peroneal nerve was intact at completion of case.   PCL Reconstruction: At this  point we began to identify the stump off the femur of the PCL.  This was grossly loose.  We began by sharply excising stump that was residual of the PCL taking care to not injure the ACL.  This point we were able to see to the back of the knee by placing the scope medial to the ACL fibers.  We then under direct visualization using spinal needle to localize and carefully place a posterior medial portal avoiding the posterior neurovascular structures.   We opened the capsule bluntly before placing a hard body cannula.  This point we checked our excursion and make sure that we were able to reach posterior lateral and distal on the tibia.  We then proceeded to use a 70 degree scope in combination with shaver and a radiofrequency ablator to clear the posterior stump of the PCL from its origin on the tibia.  At this point we checked with fluoroscopy to ensure that we were low enough just above the posterior flare of the tibia.    We then marked and cleared the central portion of the tibial insertion on the femur identifying a point just off the articular margin with enough room to allow a 9 mm reamer to avoid damage to the cartilage.  At this point we used the Arthrex PCL guide set with a flip cutter to first place a 2.4 mm guide pin, check her position on fluoroscopy to avoid interference with our tibial nail and then reamed with a barrel reamer.  We obtained good visualization of the tip of the instrument and sure that it never passed into the capsule posteriorly.    We then used a retro-reamer set guide to identify the femoral footprint of the PCL that we had marked and reamed a 25 mm tunnel into the femur again with a flip cutter taking care to avoid reaming out the medial cortex of the femur.  The graft was an allograft graft link Arthrex graft size 9.5 with buttons attached to both ends.  This point passing sutures were placed through both of these tunnels after bony debris was cleared and returned our attention to the remainder of the case.   Incisions closed with absorbable suture. The patient was awoken from general anesthesia and taken to the PACU in stable condition without complication.   Alfonse Alpers, PA-C, present and scrubbed throughout the case, critical for completion in a timely fashion, and for retraction, instrumentation, closure.

## 2021-01-26 NOTE — Discharge Instructions (Addendum)
Ramond Marrow MD, MPH Alfonse Alpers, PA-C [facility] [address] [phone] (tel)   [phone] (fax)   POST-OPERATIVE INSTRUCTIONS - KNEE  WOUND CARE - You may remove the Operative Dressing on Post-Op Day #3 (72hrs after surgery).   - Alternatively if you would like you can leave dressing on until follow-up if within 7-8 days but keep it dry. - Leave steri-strips in place until they fall off on their own, usually 2 weeks postop. - An ACE wrap may be used to control swelling, do not wrap this too tight.  If the initial ACE wrap feels too tight you may loosen it. - There may be a small amount of fluid/bleeding leaking at the surgical site.  - This is normal; the knee is filled with fluid during the procedure and can leak for 24-48hrs after surgery.  - You may change/reinforce the bandage as needed.  - Use the Cryocuff or Ice as often as possible for the first 7 days, then as needed for pain relief. Always keep a towel, ACE wrap or other barrier between the cooling unit and your skin.  - You may shower on Post-Op Day #3. Gently pat the area dry. Do not soak the knee in water or submerge it.  - Do not go swimming in the pool or ocean until 4 weeks after surgery or when otherwise instructed.  Keep incisions as dry as possible.   BRACE/AMBULATION - You will be placed in a brace post-operatively.  - Wear your brace at all times until follow-up.  - You may remove for hygiene ONLY. -           Use crutches or a walker to help you ambulate -           Do NOT put any body weight on your leg   REGIONAL ANESTHESIA (NERVE BLOCKS) - The anesthesia team may have performed a nerve block for you if safe in the setting of your care.  This is a great tool used to minimize pain.  Typically the block may start wearing off overnight.  This can be a challenging period but please utilize your as needed pain medications to try and manage this period and know it will be a  brief transition as the nerve block wears completely   POST-OP MEDICATIONS - Multimodal approach to pain control - In general your pain will be controlled with a combination of substances.  Prescriptions unless otherwise discussed are electronically sent to your pharmacy.  This is a carefully made plan we use to minimize narcotic use.     - Celebrex - Anti-inflammatory medication taken on a scheduled basis - Acetaminophen - Non-narcotic pain medicine taken on a scheduled basis  - Gabapentin - this is a medication to help with pain, take on a scheduled basis - Oxycodone - This is a strong narcotic, to be used only on an "as needed" basis for pain. - Aspirin 81mg  - This medicine is used to minimize the risk of blood clots after surgery. - Zofran - take as needed for nausea  FOLLOW-UP   Please call the office to schedule a follow-up appointment for your incision check, 7-10 days post-operatively.  IF YOU HAVE ANY QUESTIONS, PLEASE FEEL FREE TO CALL OUR OFFICE.   HELPFUL INFORMATION  - If you had a block, it will wear off between 8-24 hrs postop typically.  This is period when your pain may go from nearly zero to the pain you would  have had post-op without the block.  This is an abrupt transition but nothing dangerous is happening.  You may take an extra dose of narcotic when this happens.   Keep your leg elevated to decrease swelling, which will then in turn decrease your pain. I would elevate the foot of your bed by putting a couple of couch pillows between your mattress and box spring. I would not keep pillow directly under your ankle.  - Do not sleep with a pillow behind your knee even if it is more comfortable as this may make it harder to get your knee fully straight long term.   There will be MORE swelling on days 1-3 than there is on the day of surgery.  This also is normal. The swelling will decrease with the anti-inflammatory medication, ice and keeping it elevated. The swelling  will make it more difficult to bend your knee. As the swelling goes down your motion will become easier   You may develop swelling and bruising that extends from your knee down to your calf and perhaps even to your foot over the next week. Do not be alarmed. This too is normal, and it is due to gravity   There may be some numbness adjacent to the incision site. This may last for 6-12 months or longer in some patients and is expected.   You may return to sedentary work/school in the next couple of days when you feel up to it. You will need to keep your leg elevated as much as possible    You should wean off your narcotic medicines as soon as you are able.  Most patients will be off or using minimal narcotics before their first postop appointment.    We suggest you use the pain medication the first night prior to going to bed, in order to ease any pain when the anesthesia wears off. You should avoid taking pain medications on an empty stomach as it will make you nauseous.   Do not drink alcoholic beverages or take illicit drugs when taking pain medications.   It is against the law to drive while taking narcotics. You cannot drive if your Right leg is in brace locked in extension.   Pain medication may make you constipated.  Below are a few solutions to try in this order:  o Decrease the amount of pain medication if you aren't having pain.  o Drink lots of decaffeinated fluids.  o Drink prune juice and/or each dried prunes   o If the first 3 don't work start with additional solutions  o Take Colace - an over-the-counter stool softener  o Take Senokot - an over-the-counter laxative  o Take Miralax - a stronger over-the-counter laxative   For more information including helpful videos and documents visit our website:   [email or web address]

## 2021-01-26 NOTE — Progress Notes (Addendum)
Pt's  mother, Lanora Manis, asked volunteer to contact PACU regarding pt status. Mother is not on DPR; pt gave verbal consent for me to update her at [phone]. Mother states she is upset because Dr. Everardo Pacific called pt's wife and not her with surgical update. This RN explained that mother was not on DPR therefore, we could not give any information without patient consent.  Updated mother regarding pt condition and approximate discharge time. Mother verbalized understanding with no further questions at this time.

## 2021-01-26 NOTE — Anesthesia Postprocedure Evaluation (Signed)
Anesthesia Post Note  Patient: Frank Cross  Procedure(s) Performed: KNEE ARTHROSCOPY WITH ANTERIOR CRUCIATE LIGAMENT (ACL) AND POSTERIOR CRUCIATE LIGAMENT (PCL) POST-LAT CORNER (PLC) REPAIRS (Left: Knee)     Patient location during evaluation: PACU Anesthesia Type: General Level of consciousness: awake and alert Pain management: pain level controlled Vital Signs Assessment: post-procedure vital signs reviewed and stable Respiratory status: spontaneous breathing, nonlabored ventilation, respiratory function stable and patient connected to nasal cannula oxygen Cardiovascular status: blood pressure returned to baseline and stable Postop Assessment: no apparent nausea or vomiting Anesthetic complications: no   No notable events documented.  Last Vitals:  Vitals:   01/26/21 1251 01/26/21 1313  BP:  (!) 133/91  Pulse: 81 76  Resp: 14 14  Temp: 36.5 C   SpO2: 93% 96%    Last Pain:  Vitals:   01/26/21 1313  TempSrc:   PainSc: 0-No pain                 Trevor Iha

## 2021-01-26 NOTE — Anesthesia Preprocedure Evaluation (Addendum)
Anesthesia Evaluation  Patient identified by MRN, date of birth, ID band Patient awake    Reviewed: Allergy & Precautions, NPO status , Patient's Chart, lab work & pertinent test results  Airway Mallampati: II  TM Distance: >3 FB Neck ROM: Full    Dental no notable dental hx. (+) Teeth Intact, Dental Advisory Given   Pulmonary Current SmokerPatient did not abstain from smoking.,    Pulmonary exam normal breath sounds clear to auscultation       Cardiovascular Normal cardiovascular exam Rhythm:Regular Rate:Normal     Neuro/Psych    GI/Hepatic negative GI ROS, Neg liver ROS,   Endo/Other    Renal/GU Lab Results      Component                Value               Date                      CREATININE               0.95                12/05/2020                BUN                      9                   12/05/2020                NA                       136                 12/05/2020                K                        4.4                 12/05/2020                CL                       101                 12/05/2020                CO2                      27                  12/05/2020                Musculoskeletal  (+) Arthritis ,   Abdominal   Peds  Hematology Lab Results      Component                Value               Date                      WBC                      8.9  01/26/2021                HGB                      12.7 (L)            01/26/2021                HCT                      38.9 (L)            01/26/2021                MCV                      92.8                01/26/2021                PLT                      361                 01/26/2021              Anesthesia Other Findings   Reproductive/Obstetrics negative OB ROS                             Anesthesia Physical Anesthesia Plan  ASA: 2  Anesthesia Plan: General   Post-op Pain  Management:  Regional for Post-op pain   Induction: Intravenous  PONV Risk Score and Plan: 2 and Treatment may vary due to age or medical condition, Ondansetron, Midazolam and Dexamethasone  Airway Management Planned: LMA and Oral ETT  Additional Equipment: None  Intra-op Plan:   Post-operative Plan:   Informed Consent: I have reviewed the patients History and Physical, chart, labs and discussed the procedure including the risks, benefits and alternatives for the proposed anesthesia with the patient or authorized representative who has indicated his/her understanding and acceptance.     Dental advisory given  Plan Discussed with: CRNA  Anesthesia Plan Comments: (GA w L adductor + Poppliteal)        Anesthesia Quick Evaluation

## 2021-01-26 NOTE — Anesthesia Procedure Notes (Signed)
Procedure Name: LMA Insertion Date/Time: 01/26/2021 7:52 AM Performed by: Sindy Guadeloupe, CRNA Pre-anesthesia Checklist: Patient identified, Emergency Drugs available, Suction available, Patient being monitored and Timeout performed Patient Re-evaluated:Patient Re-evaluated prior to induction Oxygen Delivery Method: Circle system utilized Preoxygenation: Pre-oxygenation with 100% oxygen Induction Type: IV induction Ventilation: Mask ventilation without difficulty LMA: LMA inserted and LMA with gastric port inserted LMA Size: 4.0 Number of attempts: 1 Tube secured with: Tape Dental Injury: Teeth and Oropharynx as per pre-operative assessment

## 2021-01-31 ENCOUNTER — Encounter (HOSPITAL_COMMUNITY): Payer: Self-pay | Admitting: Orthopaedic Surgery

## 2021-02-02 ENCOUNTER — Encounter (HOSPITAL_COMMUNITY): Payer: Self-pay | Admitting: Orthopaedic Surgery

## 2021-10-10 IMAGING — MR MR KNEE*L* W/O CM
6 of 14 series · 18 of 40 positions shown · non-contrast
Comparison: Radiograph 12/04/2020.

CLINICAL DATA: Knee trauma, meniscal/ligament injury suspected,
xray done (Age >= 1y)

EXAM:
MRI OF THE LEFT KNEE WITHOUT CONTRAST
TECHNIQUE: Multiplanar, multisequence MR imaging of the knee was performed. No
intravenous contrast was administered.

[Series 12: STIR · axial · left · 3.0mm · 0.55mm/px · z∈[-104,+12]mm · 3 of 36 slices shown (1 of 2)]
[im 1/36]
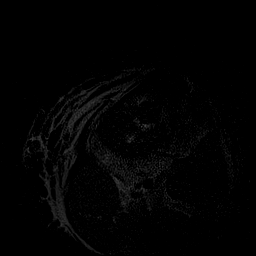
[im 18/36]
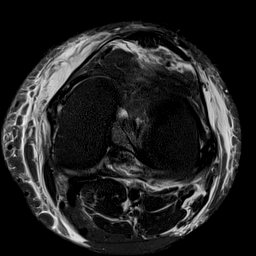
[im 36/36]
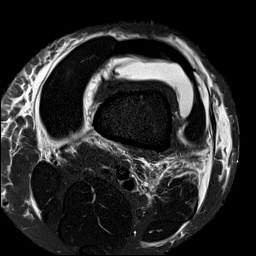

[Series 13: T1 · coronal · left · 3.0mm · 0.44mm/px · 3 of 36 slices shown]
[im 1/36]
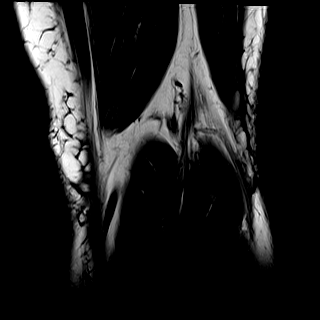
[im 18/36]
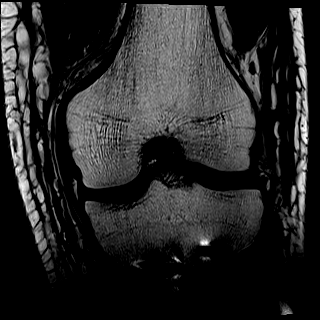
[im 36/36]
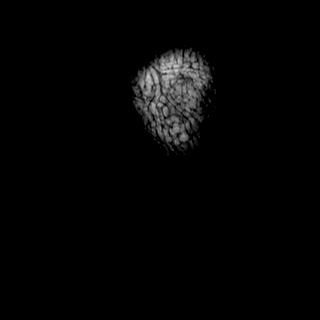

[Series 14: STIR · coronal · left · 3.0mm · 0.55mm/px · 3 of 36 slices shown (2 of 2)]
[im 1/36]
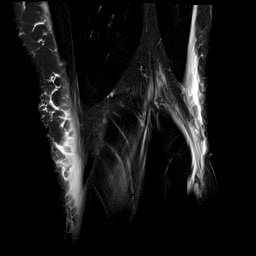
[im 18/36]
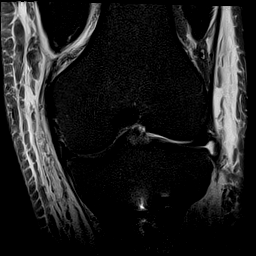
[im 36/36]
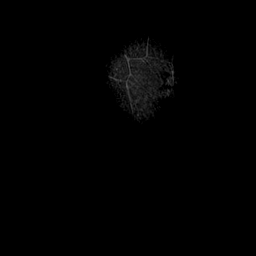

[Series 15: PD · coronal · left · 3.0mm · 0.55mm/px · 3 of 36 slices shown (1 of 3)]
[im 1/36]
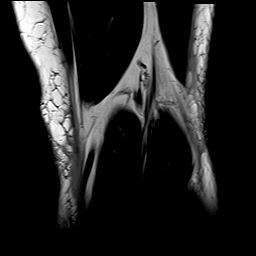
[im 18/36]
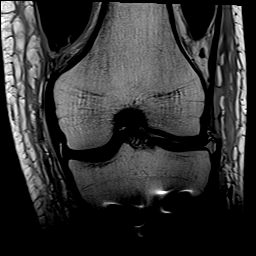
[im 36/36]
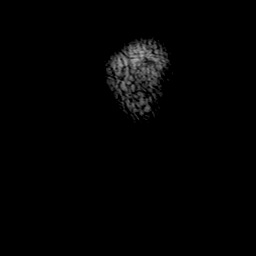

[Series 16: PD · sagittal · left · 3.0mm · 0.59mm/px · 3 of 36 slices shown (2 of 3)]
[im 1/36]
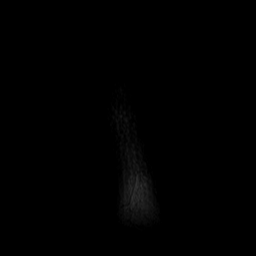
[im 18/36]
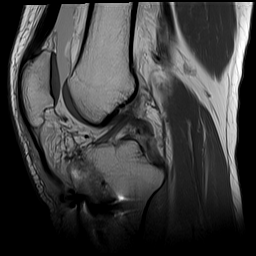
[im 36/36]
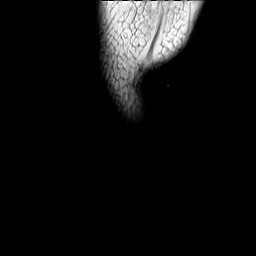

[Series 17: PD · axial · left · 3.0mm · 0.59mm/px · z∈[-104,+12]mm · 3 of 36 slices shown (3 of 3)]
[im 1/36]
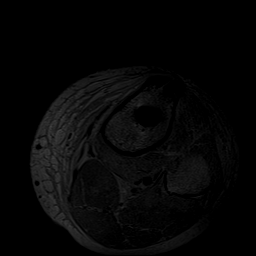
[im 18/36]
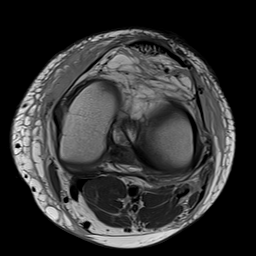
[im 36/36]
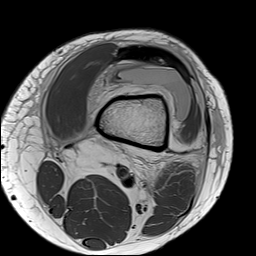

[18 of 40 positions shown; findings below may reference images not displayed]

FINDINGS: Altered MRI protocol due to the presence of metal artifact.

MENISCI

Medial: Intact.

Lateral: Intact.

LIGAMENTS

Cruciates: Possible low-grade partial tear of the proximal ACL
(axial stir image 12). There is a high-grade, possibly complete tear
of the midsubstance of the PCL (coronal STIR image 14, axial stir
image 12, sagittal PD image 19).

Collaterals: Medial collateral ligament is intact. There is a
complete tear of the distal lateral collateral ligament, with laxity
(axial stir images 23-27, coronal stir image 25). There is extensive
edema along the posterolateral corner, with possible injuries to the
arcuate ligament and or popliteofibular ligament as well. The
lateral meniscotibial ligament is torn and detached from the tibia
(coronal stir images 21-24). The popliteus tendon and biceps femoris
appear intact.

CARTILAGE

Patellofemoral:  No chondral defect.

Medial:  No chondral defect.

Lateral:  No chondral defect.

JOINT: Moderate-sized joint effusion.

POPLITEAL FOSSA: No Baker's cyst.

EXTENSOR MECHANISM: Intact quadriceps tendon. Intact patellar
tendon.

BONES: Susceptibility artifact from intramedullary nailing of the
tibia. No acute fracture involving the patella or distal femur. Bony
edema in the fibular head.

Other: Extensive edema within the popliteus muscle and proximal
soleus muscle.
IMPRESSION: High-grade/possibly complete midsubstance tear of the PCL. Possible
low-grade partial tear of the proximal ACL.

Findings of posterolateral corner injury, including a complete tear
of the distal lateral collateral ligament and detached lateral
meniscotibial ligament from the tibia. Possible injuries to the
arcuate ligament and popliteofibular ligament. Reactive
intramuscular edema within the popliteus muscle and soleus muscle.
Bony edema in the fibular head. The popliteus tendon and biceps
femoris tendons are intact.

Susceptibility artifact from intramedullary nailing in the tibia. No
acute fracture involving the patella or distal femur.

No evidence of meniscus tear.

## 2022-02-26 ENCOUNTER — Ambulatory Visit: Payer: Self-pay | Admitting: Student

## 2022-02-26 DIAGNOSIS — S82202M Unspecified fracture of shaft of left tibia, subsequent encounter for open fracture type I or II with nonunion: Secondary | ICD-10-CM

## 2022-03-06 ENCOUNTER — Encounter (HOSPITAL_COMMUNITY): Payer: Self-pay | Admitting: Student

## 2022-03-06 ENCOUNTER — Other Ambulatory Visit: Payer: Self-pay

## 2022-03-06 NOTE — Progress Notes (Signed)
Spoke with pt for pre-op call. Pt denies cardiac history, HTN or diabetes.   Shower instructions given to pt and he voiced understanding.

## 2022-03-07 NOTE — Progress Notes (Signed)
[place]- Preparing For Surgery  Before surgery, you can play an important role. Because skin is not sterile, your skin needs to be as free of germs as possible. You can reduce the number of germs on your skin by washing with CHG (chlorahexidine gluconate) Soap before surgery.  CHG is an antiseptic cleaner which kills germs and bonds with the skin to continue killing germs even after washing.    Oral Hygiene is also important to reduce your risk of infection.  Remember - BRUSH YOUR TEETH THE MORNING OF SURGERY WITH YOUR REGULAR TOOTHPASTE  Please do not use if you have an allergy to CHG or antibacterial soaps. If your skin becomes reddened/irritated stop using the CHG.  Do not shave (including legs and underarms) for at least 48 hours prior to first CHG shower. It is OK to shave your face.  Please follow these instructions carefully.   Shower the NIGHT BEFORE SURGERY and the MORNING OF SURGERY with CHG.   If you chose to wash your hair, wash your hair first as usual with your normal shampoo.  After you shampoo, rinse your hair and body thoroughly to remove the shampoo.  Use CHG as you would any other liquid soap. You can apply CHG directly to the skin and wash gently with a scrungie or a clean washcloth.   Apply the CHG Soap to your body ONLY FROM THE NECK DOWN.  Do not use on open wounds or open sores. Avoid contact with your eyes, ears, mouth and genitals (private parts). Wash Face and genitals (private parts)  with your normal soap.  Wash thoroughly, paying special attention to the area where your surgery will be performed.  Thoroughly rinse your body with warm water from the neck down.  DO NOT shower/wash with your normal soap after using and rinsing off the CHG Soap.  Pat yourself dry with a CLEAN TOWEL.  Wear CLEAN PAJAMAS to bed the night before surgery, wear comfortable clothes the morning of surgery  Place CLEAN SHEETS on your bed the night of your first shower and DO NOT  SLEEP WITH PETS.    Day of Surgery: Shower as stated above. Do not apply any deodorants/lotions.  Please wear clean clothes to the hospital/surgery center.   Remember to brush your teeth WITH YOUR REGULAR TOOTHPASTE.   

## 2022-03-07 NOTE — Anesthesia Preprocedure Evaluation (Addendum)
Anesthesia Evaluation  Patient identified by MRN, date of birth, ID band Patient awake    Reviewed: Allergy & Precautions, NPO status , Patient's Chart, lab work & pertinent test results  Airway Mallampati: III  TM Distance: >3 FB Neck ROM: Full    Dental  (+) Dental Advisory Given, Poor Dentition, Missing   Pulmonary Current Smoker and Patient abstained from smoking.   Pulmonary exam normal breath sounds clear to auscultation       Cardiovascular negative cardio ROS Normal cardiovascular exam Rhythm:Regular Rate:Normal     Neuro/Psych  PSYCHIATRIC DISORDERS Anxiety Depression    negative neurological ROS     GI/Hepatic negative GI ROS, Neg liver ROS,,,  Endo/Other  negative endocrine ROS    Renal/GU negative Renal ROS     Musculoskeletal negative musculoskeletal ROS (+)  Left tibia nonunion   Abdominal   Peds  (+) ADHD Hematology negative hematology ROS (+)   Anesthesia Other Findings   Reproductive/Obstetrics                             Anesthesia Physical Anesthesia Plan  ASA: 2  Anesthesia Plan: General   Post-op Pain Management: Tylenol PO (pre-op)* and Regional block*   Induction: Intravenous  PONV Risk Score and Plan: 2 and Midazolam, Dexamethasone and Ondansetron  Airway Management Planned: Oral ETT  Additional Equipment:   Intra-op Plan:   Post-operative Plan: Extubation in OR  Informed Consent: I have reviewed the patients History and Physical, chart, labs and discussed the procedure including the risks, benefits and alternatives for the proposed anesthesia with the patient or authorized representative who has indicated his/her understanding and acceptance.     Dental advisory given  Plan Discussed with: CRNA  Anesthesia Plan Comments:        Anesthesia Quick Evaluation

## 2022-03-08 ENCOUNTER — Other Ambulatory Visit (HOSPITAL_COMMUNITY): Payer: Self-pay

## 2022-03-08 ENCOUNTER — Ambulatory Visit (HOSPITAL_COMMUNITY)
Admission: RE | Admit: 2022-03-08 | Discharge: 2022-03-08 | Disposition: A | Discharge status: Home / Self Care | Payer: Medicaid Other | Attending: Student | Admitting: Student

## 2022-03-08 ENCOUNTER — Ambulatory Visit (HOSPITAL_COMMUNITY): Payer: Medicaid Other

## 2022-03-08 ENCOUNTER — Encounter (HOSPITAL_COMMUNITY)
Admission: RE | Disposition: A | Discharge status: Home / Self Care | Payer: Self-pay | Source: Home / Self Care | Attending: Student

## 2022-03-08 ENCOUNTER — Other Ambulatory Visit: Payer: Self-pay

## 2022-03-08 ENCOUNTER — Ambulatory Visit (HOSPITAL_BASED_OUTPATIENT_CLINIC_OR_DEPARTMENT_OTHER): Payer: Medicaid Other | Admitting: Anesthesiology

## 2022-03-08 ENCOUNTER — Encounter (HOSPITAL_COMMUNITY): Payer: Self-pay | Admitting: Student

## 2022-03-08 ENCOUNTER — Ambulatory Visit (HOSPITAL_COMMUNITY): Payer: Medicaid Other | Admitting: Anesthesiology

## 2022-03-08 DIAGNOSIS — S82202K Unspecified fracture of shaft of left tibia, subsequent encounter for closed fracture with nonunion: Secondary | ICD-10-CM | POA: Diagnosis not present

## 2022-03-08 DIAGNOSIS — S82292M Other fracture of shaft of left tibia, subsequent encounter for open fracture type I or II with nonunion: Secondary | ICD-10-CM | POA: Insufficient documentation

## 2022-03-08 DIAGNOSIS — X58XXXD Exposure to other specified factors, subsequent encounter: Secondary | ICD-10-CM | POA: Diagnosis not present

## 2022-03-08 DIAGNOSIS — F32A Depression, unspecified: Secondary | ICD-10-CM | POA: Insufficient documentation

## 2022-03-08 DIAGNOSIS — F17210 Nicotine dependence, cigarettes, uncomplicated: Secondary | ICD-10-CM | POA: Insufficient documentation

## 2022-03-08 DIAGNOSIS — F419 Anxiety disorder, unspecified: Secondary | ICD-10-CM | POA: Insufficient documentation

## 2022-03-08 DIAGNOSIS — S82202M Unspecified fracture of shaft of left tibia, subsequent encounter for open fracture type I or II with nonunion: Secondary | ICD-10-CM

## 2022-03-08 DIAGNOSIS — F909 Attention-deficit hyperactivity disorder, unspecified type: Secondary | ICD-10-CM | POA: Insufficient documentation

## 2022-03-08 HISTORY — PX: TIBIA IM NAIL INSERTION: SHX2516

## 2022-03-08 LAB — CBC WITH DIFFERENTIAL/PLATELET
Abs Immature Granulocytes: 0.02 10*3/uL (ref 0.00–0.07)
Basophils Absolute: 0.1 10*3/uL (ref 0.0–0.1)
Basophils Relative: 1 %
Eosinophils Absolute: 0.4 10*3/uL (ref 0.0–0.5)
Eosinophils Relative: 5 %
HCT: 37 % — ABNORMAL LOW (ref 39.0–52.0)
Hemoglobin: 12.7 g/dL — ABNORMAL LOW (ref 13.0–17.0)
Immature Granulocytes: 0 %
Lymphocytes Relative: 23 %
Lymphs Abs: 2 10*3/uL (ref 0.7–4.0)
MCH: 30.5 pg (ref 26.0–34.0)
MCHC: 34.3 g/dL (ref 30.0–36.0)
MCV: 88.7 fL (ref 80.0–100.0)
Monocytes Absolute: 0.7 10*3/uL (ref 0.1–1.0)
Monocytes Relative: 9 %
Neutro Abs: 5.4 10*3/uL (ref 1.7–7.7)
Neutrophils Relative %: 62 %
Platelets: 312 10*3/uL (ref 150–400)
RBC: 4.17 MIL/uL — ABNORMAL LOW (ref 4.22–5.81)
RDW: 13.4 % (ref 11.5–15.5)
WBC: 8.6 10*3/uL (ref 4.0–10.5)
nRBC: 0 % (ref 0.0–0.2)

## 2022-03-08 LAB — BASIC METABOLIC PANEL
Anion gap: 7 (ref 5–15)
BUN: 15 mg/dL (ref 6–20)
CO2: 25 mmol/L (ref 22–32)
Calcium: 8.5 mg/dL — ABNORMAL LOW (ref 8.9–10.3)
Chloride: 108 mmol/L (ref 98–111)
Creatinine, Ser: 0.89 mg/dL (ref 0.61–1.24)
GFR, Estimated: >60 mL/min (ref >60)
Glucose, Bld: 106 mg/dL — ABNORMAL HIGH (ref 70–99)
Potassium: 3.7 mmol/L (ref 3.5–5.1)
Sodium: 140 mmol/L (ref 135–145)

## 2022-03-08 LAB — SEDIMENTATION RATE: Sed Rate: 6 mm/hr (ref 0–16)

## 2022-03-08 LAB — C-REACTIVE PROTEIN: CRP: 1.5 mg/dL — ABNORMAL HIGH (ref <1.0)

## 2022-03-08 SURGERY — INSERTION, INTRAMEDULLARY ROD, TIBIA
Anesthesia: General | Site: Leg Lower | Laterality: Left

## 2022-03-08 MED ORDER — ACETAMINOPHEN 500 MG PO TABS
1000.0000 mg | ORAL_TABLET | Freq: Once | ORAL | Status: AC
  Administered 2022-03-08: 1000 mg via ORAL
  Filled 2022-03-08: qty 2

## 2022-03-08 MED ORDER — ROCURONIUM BROMIDE 10 MG/ML (PF) SYRINGE
PREFILLED_SYRINGE | INTRAVENOUS | Status: DC | PRN
  Administered 2022-03-08: 20 mg via INTRAVENOUS
  Administered 2022-03-08: 60 mg via INTRAVENOUS

## 2022-03-08 MED ORDER — DEXAMETHASONE SODIUM PHOSPHATE 4 MG/ML IJ SOLN
INTRAMUSCULAR | Status: DC | PRN
  Administered 2022-03-08: 6 mg via INTRAVENOUS

## 2022-03-08 MED ORDER — CEFAZOLIN SODIUM-DEXTROSE 2-4 GM/100ML-% IV SOLN
2.0000 g | INTRAVENOUS | Status: AC
  Administered 2022-03-08: 2 g via INTRAVENOUS
  Filled 2022-03-08: qty 100

## 2022-03-08 MED ORDER — ROPIVACAINE HCL 5 MG/ML IJ SOLN
INTRAMUSCULAR | Status: DC | PRN
  Administered 2022-03-08: 30 mL via PERINEURAL

## 2022-03-08 MED ORDER — FENTANYL CITRATE (PF) 100 MCG/2ML IJ SOLN
INTRAMUSCULAR | Status: DC | PRN
  Administered 2022-03-08 (×5): 50 ug via INTRAVENOUS

## 2022-03-08 MED ORDER — LIDOCAINE 2% (20 MG/ML) 5 ML SYRINGE
INTRAMUSCULAR | Status: DC | PRN
  Administered 2022-03-08: 60 mg via INTRAVENOUS

## 2022-03-08 MED ORDER — SUGAMMADEX SODIUM 200 MG/2ML IV SOLN
INTRAVENOUS | Status: DC | PRN
  Administered 2022-03-08: 200 mg via INTRAVENOUS

## 2022-03-08 MED ORDER — VANCOMYCIN HCL 1000 MG IV SOLR
INTRAVENOUS | Status: DC | PRN
  Administered 2022-03-08: 1000 mg via TOPICAL

## 2022-03-08 MED ORDER — LIDOCAINE 2% (20 MG/ML) 5 ML SYRINGE
INTRAMUSCULAR | Status: AC
  Filled 2022-03-08: qty 5

## 2022-03-08 MED ORDER — PROMETHAZINE HCL 25 MG/ML IJ SOLN: 6.2500 mg | INTRAMUSCULAR | Status: DC | PRN

## 2022-03-08 MED ORDER — OXYCODONE HCL 5 MG/5ML PO SOLN: 5.0000 mg | Freq: Once | ORAL | Status: AC | PRN

## 2022-03-08 MED ORDER — PROPOFOL 10 MG/ML IV BOLUS
INTRAVENOUS | Status: AC
  Filled 2022-03-08: qty 20

## 2022-03-08 MED ORDER — OXYCODONE HCL 5 MG PO TABS
5.0000 mg | ORAL_TABLET | Freq: Once | ORAL | Status: AC | PRN
  Administered 2022-03-08: 5 mg via ORAL

## 2022-03-08 MED ORDER — ONDANSETRON HCL 4 MG/2ML IJ SOLN
INTRAMUSCULAR | Status: DC | PRN
  Administered 2022-03-08: 4 mg via INTRAVENOUS

## 2022-03-08 MED ORDER — DEXAMETHASONE SODIUM PHOSPHATE 10 MG/ML IJ SOLN
INTRAMUSCULAR | Status: AC
  Filled 2022-03-08: qty 1

## 2022-03-08 MED ORDER — CHLORHEXIDINE GLUCONATE 0.12 % MT SOLN
15.0000 mL | Freq: Once | OROMUCOSAL | Status: AC
  Administered 2022-03-08: 15 mL via OROMUCOSAL
  Filled 2022-03-08: qty 15

## 2022-03-08 MED ORDER — ASPIRIN 81 MG PO TBEC
81.0000 mg | DELAYED_RELEASE_TABLET | Freq: Every day | ORAL | 0 refills | Status: AC
  Filled 2022-03-08 (×2): qty 30, 30d supply, fill #0

## 2022-03-08 MED ORDER — VANCOMYCIN HCL 1000 MG IV SOLR
INTRAVENOUS | Status: AC
  Filled 2022-03-08: qty 20

## 2022-03-08 MED ORDER — MIDAZOLAM HCL 5 MG/5ML IJ SOLN
INTRAMUSCULAR | Status: DC | PRN
  Administered 2022-03-08: 2 mg via INTRAVENOUS

## 2022-03-08 MED ORDER — OXYCODONE HCL 5 MG PO TABS
ORAL_TABLET | ORAL | Status: AC
  Filled 2022-03-08: qty 1

## 2022-03-08 MED ORDER — 0.9 % SODIUM CHLORIDE (POUR BTL) OPTIME
TOPICAL | Status: DC | PRN
  Administered 2022-03-08: 1000 mL

## 2022-03-08 MED ORDER — MIDAZOLAM HCL 2 MG/2ML IJ SOLN
INTRAMUSCULAR | Status: AC
  Filled 2022-03-08: qty 2

## 2022-03-08 MED ORDER — ONDANSETRON HCL 4 MG/2ML IJ SOLN
INTRAMUSCULAR | Status: AC
  Filled 2022-03-08: qty 2

## 2022-03-08 MED ORDER — ROCURONIUM BROMIDE 10 MG/ML (PF) SYRINGE
PREFILLED_SYRINGE | INTRAVENOUS | Status: AC
  Filled 2022-03-08: qty 10

## 2022-03-08 MED ORDER — ORAL CARE MOUTH RINSE: 15.0000 mL | Freq: Once | OROMUCOSAL | Status: AC

## 2022-03-08 MED ORDER — PROPOFOL 10 MG/ML IV BOLUS
INTRAVENOUS | Status: DC | PRN
  Administered 2022-03-08: 170 mg via INTRAVENOUS

## 2022-03-08 MED ORDER — FENTANYL CITRATE (PF) 100 MCG/2ML IJ SOLN
25.0000 ug | INTRAMUSCULAR | Status: DC | PRN
  Administered 2022-03-08 (×2): 50 ug via INTRAVENOUS

## 2022-03-08 MED ORDER — METHOCARBAMOL 500 MG PO TABS
500.0000 mg | ORAL_TABLET | Freq: Four times a day (QID) | ORAL | 0 refills | Status: AC | PRN
  Filled 2022-03-08: qty 28, 7d supply, fill #0

## 2022-03-08 MED ORDER — OXYCODONE-ACETAMINOPHEN 5-325 MG PO TABS
1.0000 | ORAL_TABLET | ORAL | 0 refills | Status: AC | PRN
  Filled 2022-03-08: qty 30, 5d supply, fill #0

## 2022-03-08 MED ORDER — FENTANYL CITRATE (PF) 250 MCG/5ML IJ SOLN
INTRAMUSCULAR | Status: AC
  Filled 2022-03-08: qty 5

## 2022-03-08 MED ORDER — FENTANYL CITRATE (PF) 100 MCG/2ML IJ SOLN
INTRAMUSCULAR | Status: AC
  Filled 2022-03-08: qty 2

## 2022-03-08 MED ORDER — LACTATED RINGERS IV SOLN: INTRAVENOUS | Status: DC

## 2022-03-08 SURGICAL SUPPLY — 67 items
APL PRP STRL LF DISP 70% ISPRP (MISCELLANEOUS) ×1
APL SKNCLS STERI-STRIP NONHPOA (GAUZE/BANDAGES/DRESSINGS) ×2
BAG COUNTER SPONGE SURGICOUNT (BAG) ×1 IMPLANT
BAG SPNG CNTER NS LX DISP (BAG)
BENZOIN TINCTURE PRP APPL 2/3 (GAUZE/BANDAGES/DRESSINGS) IMPLANT
BIT DRILL FLEXIBLE LONG 12 (BIT) IMPLANT
BIT DRILL LONG 4.2 (BIT) IMPLANT
BIT DRILL SHORT 4.2 (BIT) IMPLANT
BLADE SURG 10 STRL SS (BLADE) ×2 IMPLANT
BNDG CMPR STD VLCR NS LF 5.8X4 (GAUZE/BANDAGES/DRESSINGS) ×1
BNDG COHESIVE 4X5 TAN STRL (GAUZE/BANDAGES/DRESSINGS) ×1 IMPLANT
BNDG ELASTIC 4X5.8 VLCR NS LF (GAUZE/BANDAGES/DRESSINGS) IMPLANT
BNDG ELASTIC 4X5.8 VLCR STR LF (GAUZE/BANDAGES/DRESSINGS) ×1 IMPLANT
BNDG ELASTIC 6X5.8 VLCR STR LF (GAUZE/BANDAGES/DRESSINGS) ×1 IMPLANT
BNDG GAUZE DERMACEA FLUFF 4 (GAUZE/BANDAGES/DRESSINGS) ×1 IMPLANT
BNDG GZE DERMACEA 4 6PLY (GAUZE/BANDAGES/DRESSINGS)
BRUSH SCRUB EZ  4% CHG (MISCELLANEOUS) ×1
BRUSH SCRUB EZ 4% CHG (MISCELLANEOUS) IMPLANT
CHLORAPREP W/TINT 26 (MISCELLANEOUS) ×1 IMPLANT
CLSR STERI-STRIP ANTIMIC 1/2X4 (GAUZE/BANDAGES/DRESSINGS) IMPLANT
COVER SURGICAL LIGHT HANDLE (MISCELLANEOUS) ×2 IMPLANT
DRAPE C-ARM 42X72 X-RAY (DRAPES) ×1 IMPLANT
DRAPE C-ARMOR (DRAPES) ×1 IMPLANT
DRAPE HALF SHEET 40X57 (DRAPES) ×2 IMPLANT
DRAPE IMP U-DRAPE 54X76 (DRAPES) ×2 IMPLANT
DRAPE ORTHO SPLIT 77X108 STRL (DRAPES) ×2
DRAPE SURG ORHT 6 SPLT 77X108 (DRAPES) ×2 IMPLANT
DRAPE U-SHAPE 47X51 STRL (DRAPES) ×1 IMPLANT
DRILL BIT SHORT 4.2 (BIT) ×2
DRSG ADAPTIC 3X8 NADH LF (GAUZE/BANDAGES/DRESSINGS) ×1 IMPLANT
ELECT REM PT RETURN 9FT ADLT (ELECTROSURGICAL) ×1
ELECTRODE REM PT RTRN 9FT ADLT (ELECTROSURGICAL) ×1 IMPLANT
GAUZE SPONGE 4X4 12PLY STRL (GAUZE/BANDAGES/DRESSINGS) ×1 IMPLANT
GLOVE BIO SURGEON STRL SZ 6.5 (GLOVE) ×3 IMPLANT
GLOVE BIO SURGEON STRL SZ7.5 (GLOVE) ×4 IMPLANT
GLOVE BIOGEL PI IND STRL 6.5 (GLOVE) ×1 IMPLANT
GLOVE BIOGEL PI IND STRL 7.0 (GLOVE) IMPLANT
GLOVE BIOGEL PI IND STRL 7.5 (GLOVE) ×1 IMPLANT
GOWN STRL REUS W/ TWL LRG LVL3 (GOWN DISPOSABLE) ×2 IMPLANT
GOWN STRL REUS W/TWL LRG LVL3 (GOWN DISPOSABLE) ×3
GUIDEPIN 2.5 SYNTHES (INSTRUMENTS) IMPLANT
GUIDEWIRE 3.2X400 (WIRE) IMPLANT
KIT BASIN OR (CUSTOM PROCEDURE TRAY) ×1 IMPLANT
KIT TURNOVER KIT B (KITS) ×1 IMPLANT
NAIL TFNA TIB 11X315 STL (Nail) IMPLANT
PACK TOTAL JOINT (CUSTOM PROCEDURE TRAY) ×1 IMPLANT
PAD ABD 8X10 STRL (GAUZE/BANDAGES/DRESSINGS) IMPLANT
PAD ARMBOARD 7.5X6 YLW CONV (MISCELLANEOUS) ×2 IMPLANT
PAD CAST 4YDX4 CTTN HI CHSV (CAST SUPPLIES) IMPLANT
PADDING CAST COTTON 4X4 STRL (CAST SUPPLIES) ×1
PADDING CAST COTTON 6X4 STRL (CAST SUPPLIES) IMPLANT
REAMER ROD DEEP FLUTE 2.5X950 (INSTRUMENTS) IMPLANT
SCREW LOCK HDLS 5X34 (Screw) IMPLANT
SCREW LOCK HDLS 5X42 (Screw) IMPLANT
SCREW LOCK LP 5X36 LT (Screw) IMPLANT
SCREW LOCK LP HDLS 5X68 (Screw) IMPLANT
STAPLER VISISTAT 35W (STAPLE) ×1 IMPLANT
SUT MNCRL AB 3-0 PS2 18 (SUTURE) ×1 IMPLANT
SUT MNCRL AB 3-0 PS2 27 (SUTURE) IMPLANT
SUT VIC AB 0 CT1 27 (SUTURE) ×1
SUT VIC AB 0 CT1 27XBRD ANBCTR (SUTURE) IMPLANT
SUT VIC AB 2-0 CT1 27 (SUTURE) ×1
SUT VIC AB 2-0 CT1 TAPERPNT 27 (SUTURE) IMPLANT
SYR 10ML LL (SYRINGE) IMPLANT
TOWEL GREEN STERILE (TOWEL DISPOSABLE) ×2 IMPLANT
TOWEL GREEN STERILE FF (TOWEL DISPOSABLE) ×1 IMPLANT
YANKAUER SUCT BULB TIP NO VENT (SUCTIONS) IMPLANT

## 2022-03-08 NOTE — Discharge Instructions (Addendum)
[facility] Discharge Instructions   General Discharge Instructions  WEIGHT BEARING STATUS:Weightbearing as tolerated  RANGE OF MOTION/ACTIVITY: ok for knee range of motion as tolerated  Wound Care:Reinforce your dressings as needed for the first few days. You may remove your surgical dressing on post-op day #2 (Friday 03/10/22). Leave steri-strips in place. Incisions can be left open to air at this point if there is no drainage. If incision continues to have drainage, follow wound care instructions below. Okay to shower if no drainage from incisions.  DVT/PE prophylaxis: Aspirin 81 mg daily x 30 days  Diet: as you were eating previously.  Can use over the counter stool softeners and bowel preparations, such as Miralax, to help with bowel movements.  Narcotics can be constipating.  Be sure to drink plenty of fluids  PAIN MEDICATION USE AND EXPECTATIONS  You have likely been given narcotic medications to help control your pain.  After a traumatic event that results in an fracture (broken bone) with or without surgery, it is ok to use narcotic pain medications to help control one's pain.  We understand that everyone responds to pain differently and each individual patient will be evaluated on a regular basis for the continued need for narcotic medications. Ideally, narcotic medication use should last no more than 6-8 weeks (coinciding with fracture healing).   As a patient it is your responsibility as well to monitor narcotic medication use and report the amount and frequency you use these medications when you come to your office visit.   We would also advise that if you are using narcotic medications, you should take a dose prior to therapy to maximize you participation.  IF YOU ARE ON NARCOTIC MEDICATIONS IT IS NOT PERMISSIBLE TO OPERATE A MOTOR VEHICLE (MOTORCYCLE/CAR/TRUCK/MOPED) OR HEAVY MACHINERY DO NOT MIX NARCOTICS WITH OTHER CNS (CENTRAL NERVOUS SYSTEM) DEPRESSANTS SUCH  AS ALCOHOL   STOP SMOKING OR USING NICOTINE PRODUCTS!!!!  As discussed nicotine severely impairs your body's ability to heal surgical and traumatic wounds but also impairs bone healing.  Wounds and bone heal by forming microscopic blood vessels (angiogenesis) and nicotine is a vasoconstrictor (essentially, shrinks blood vessels).  Therefore, if vasoconstriction occurs to these microscopic blood vessels they essentially disappear and are unable to deliver necessary nutrients to the healing tissue.  This is one modifiable factor that you can do to dramatically increase your chances of healing your injury.    (This means no smoking, no nicotine gum, patches, etc)  DO NOT USE NONSTEROIDAL ANTI-INFLAMMATORY DRUGS (NSAID'S)  Using products such as Advil (ibuprofen), Aleve (naproxen), Motrin (ibuprofen) for additional pain control during fracture healing can delay and/or prevent the healing response.  If you would like to take over the counter (OTC) medication, Tylenol (acetaminophen) is ok.  However, some narcotic medications that are given for pain control contain acetaminophen as well. Therefore, you should not exceed more than 4000 mg of tylenol in a day if you do not have liver disease.  Also note that there are may OTC medicines, such as cold medicines and allergy medicines that my contain tylenol as well.  If you have any questions about medications and/or interactions please ask your doctor/PA or your pharmacist.      ICE AND ELEVATE INJURED/OPERATIVE EXTREMITY  Using ice and elevating the injured extremity above your heart can help with swelling and pain control.  Icing in a pulsatile fashion, such as 20 minutes on and 20 minutes off, can be followed.    Do  not place ice directly on skin. Make sure there is a barrier between to skin and the ice pack.    Using frozen items such as frozen peas works well as the conform nicely to the are that needs to be iced.  USE AN ACE WRAP OR TED HOSE FOR SWELLING  CONTROL  In addition to icing and elevation, Ace wraps or TED hose are used to help limit and resolve swelling.  It is recommended to use Ace wraps or TED hose until you are informed to stop.    When using Ace Wraps start the wrapping distally (farthest away from the body) and wrap proximally (closer to the body)   Example: If you had surgery on your leg or thing and you do not have a splint on, start the ace wrap at the toes and work your way up to the thigh        If you had surgery on your upper extremity and do not have a splint on, start the ace wrap at your fingers and work your way up to the upper arm   CALL THE OFFICE FOR MEDICATION REFILLS OR WITH ANY QUESTIONS/CONCERNS: [phone]   VISIT OUR WEBSITE FOR ADDITIONAL INFORMATION: [email or web address]    Discharge Wound Care Instructions  Do NOT apply any ointments, solutions or lotions to pin sites or surgical wounds.  These prevent needed drainage and even though solutions like hydrogen peroxide kill bacteria, they also damage cells lining the pin sites that help fight infection.  Applying lotions or ointments can keep the wounds moist and can cause them to breakdown and open up as well. This can increase the risk for infection. When in doubt call the office.  If any drainage is noted, use one layer of adaptic or Mepitel, then gauze, Kerlix, and an ace wrap. - These dressing supplies should be available at local medical supply stores [facility], [facility], etc) as well as Insurance claims handler ([facility], [facility], [place], etc)  Once the incision is completely dry and without drainage, it may be left open to air out.  Showering may begin 36-48 hours later.  Cleaning gently with soap and water.

## 2022-03-08 NOTE — Transfer of Care (Signed)
Immediate Anesthesia Transfer of Care Note  Patient: Frank Cross  Procedure(s) Performed: EXCHANGE NAILING OF LEFT TIBIA (Left: Leg Lower)  Patient Location: PACU  Anesthesia Type:General  Level of Consciousness: awake  Airway & Oxygen Therapy: Patient Spontanous Breathing and Patient connected to nasal cannula oxygen  Post-op Assessment: Report given to RN and Post -op Vital signs reviewed and stable  Post vital signs: Reviewed and stable  Last Vitals:  Vitals Value Taken Time  BP 142/91 03/08/22 1050  Temp 36.7 C 03/08/22 1050  Pulse 103 03/08/22 1053  Resp 18 03/08/22 1053  SpO2 98 % 03/08/22 1053  Vitals shown include unvalidated device data.  Last Pain:  Vitals:   03/08/22 0739  TempSrc:   PainSc: 5          Complications: No notable events documented.

## 2022-03-08 NOTE — Progress Notes (Signed)
Orthopedic Tech Progress Note Patient Details:  Frank Cross [birth year] [id number]  PACU RN called requesting a pair of crutches  Ortho Devices Type of Ortho Device: Crutches Ortho Device/Splint Interventions: Ordered, Application, Adjustment   Post Interventions Patient Tolerated: Well, Ambulated well Instructions Provided: Poper ambulation with device, Care of device  Donald Pore 03/08/2022, 2:45 PM

## 2022-03-08 NOTE — Interval H&P Note (Signed)
History and Physical Interval Note:  03/08/2022 8:15 AM  Frank Cross  has presented today for surgery, with the diagnosis of Left tibia nonunion.  The various methods of treatment have been discussed with the patient and family. After consideration of risks, benefits and other options for treatment, the patient has consented to  Procedure(s): EXCHANGE NAILING OF LEFT TIBIA (Left) as a surgical intervention.  The patient's history has been reviewed, patient examined, no change in status, stable for surgery.  I have reviewed the patient's chart and labs.  Questions were answered to the patient's satisfaction.     Caryn Bee P Karrin Eisenmenger

## 2022-03-08 NOTE — Op Note (Signed)
Orthopaedic Surgery Operative Note (CSN: [id number] ) Date of Surgery: 03/08/2022  Admit Date: 03/08/2022   Diagnoses: Pre-Op Diagnoses: Left tibial shaft nonunion  Post-Op Diagnosis: Same  Procedures: CPT 27720-Exchange nailing of left tibial nonunion CPT 20680-Removal of hardware left tibia  Surgeons : Primary: Roby Lofts, MD  Assistant: Ulyses Southward, PA-C  Location: OR 3   Anesthesia:General with regional block   Antibiotics: Ancef 2g preop with 1 gm vancomycin powder placed topically   Tourniquet time: None    Estimated Blood Loss: 25 mL  Complications:None   Specimens:None   Implants: Implant Name Type Inv. Item Serial No. Manufacturer Lot No. LRB No. Used Action  Tibial nail-advanced 73mm    DEPUY TRAUMA [id number] Left 1 Implanted  SCREW LOCK LP HDLS 5X68 - [id number] Screw SCREW LOCK LP HDLS 5X68  DEPUY ORTHOPAED[employer] [id number] Left 1 Implanted  SCREW LOCK LP 5X36 LT - [id number] Screw SCREW LOCK LP 5X36 LT  DEPUY ORTHOPAEDICS [id number] Left 1 Implanted  SCREW LOCK HDLS 5X42 - [id number] Screw SCREW LOCK HDLS 5X42  DEPUY ORTHOPAED[employer] [id number] Left 1 Implanted  SCREW LOCK HDLS 5X34 - [id number] Screw SCREW LOCK HDLS 5X34  DEPUY ORTHOPA[employer] [id number] Left 1 Implanted     Indications for Surgery: 36 year old male who had an open tibial shaft fracture that underwent intramedullary nailing and I&D in September 2022.  He subsequently developed a nonunion.  Nonoperative treatment options were attempted.  Unfortunately he continued to have significant pain and nonhealing fracture on x-rays.  Due to his persistent nonunion I recommended proceeding with exchange nailing of his left tibia fracture.  Risks and benefits were discussed with the patient.  Risks include but not limited to bleeding, infection, malunion, nonunion, continued pain, nerve or blood vessel injury, DVT, even possible anesthetic complications.  He agreed to proceed with surgery and consent was  obtained.  Operative Findings: 1.  Removal of previous intramedullary nail.  Two of the interlocking screws were broken and had to be removed. 2.  Exchange nailing of left tibia using Synthes 11 x 315 mm TNA  Procedure: The patient was identified in the preoperative holding area. Consent was confirmed with the patient and their family and all questions were answered. The operative extremity was marked after confirmation with the patient. he was then brought back to the operating room by our anesthesia colleagues.  He was carefully transferred over to radiolucent flattop table.  He was placed under general anesthetic.  A bump was placed under his operative hip.  The left lower extremity was then prepped and draped in usual sterile fashion.  A timeout was performed to verify the patient, the procedure, and the extremity.  Preoperative antibiotics were dosed.  Fluoroscopic imaging showed his previous hardware in place in the nonunion site.  I for started out by removing the proximal interlocking screw.  Unfortunately this was broken so I was only able to remove the medial portion of it.  I then reopened the anterior incision and carried it down through skin and subcutaneous tissue.  I then performed a lateral retinacular release to access the starting point to threat in the conical extraction bolt into the proximal portion of the nail.  I was able to do this successfully.  I then removed the distal interlocking screws.  Unfortunately the proximal distal interlocking screw was broken and I was only able to remove the medial portion.  I then proceeded to use a small tamp to tamp the broken screws out of the center of  the nail to allow for successful removal.  I made a counterincision on the lateral side to grasp the screw and remove it.  Once I had all of the interlocking screws out I then remove the nail by backslapping the extraction bolt.  I was able to remove the nail without significant difficulty.  I  then passed a ball-tipped guidewire down the center of the canal.  I measured the length and I decided to use a 350 mm nail as the a 330 mm nail was used previously.  The previous nail was a 9 mm nail and I started with a 10 mm reamer.  I then reamed up to 12.5 mm and placed an 11 mm nail.  I then used the targeting arm to place 2 proximal interlocking screws.  I then used perfect circle technique to place 2 distal interlocking screws.  Final fluoroscopic imaging was obtained.  The incision was copiously irrigated.  A gram of vancomycin powder was placed into the incision.  A layered closure of 0 Vicryl, 2-0 Vicryl and 3-0 Monocryl with Steri-Strips were used to close the skin.  Sterile dressing was applied.  The patient was then awoke from anesthesia and taken to the PACU in stable condition.  Post Op Plan/Instructions: The patient will be weightbearing as tolerated to the left lower extremity.  Will plan for discharge home postoperatively.  Plan for aspirin 81 mg for DVT prophylaxis.  I was present and performed the entire surgery.  Ulyses Southward, PA-C did assist me throughout the case. An assistant was necessary given the difficulty in approach, maintenance of reduction and ability to instrument the fracture.   Truitt Merle, MD [facility]

## 2022-03-08 NOTE — Anesthesia Procedure Notes (Signed)
Procedure Name: Intubation Date/Time: 03/08/2022 8:41 AM  Performed by: Santa Lighter, MDPre-anesthesia Checklist: Patient identified, Emergency Drugs available, Suction available and Patient being monitored Patient Re-evaluated:Patient Re-evaluated prior to induction Oxygen Delivery Method: Circle system utilized Preoxygenation: Pre-oxygenation with 100% oxygen Induction Type: IV induction Ventilation: Mask ventilation without difficulty Laryngoscope Size: Miller, Mac, 4 and 3 Grade View: Grade II Tube type: Oral Tube size: 7.5 mm Number of attempts: 2 Airway Equipment and Method: Stylet Placement Confirmation: ETT inserted through vocal cords under direct vision, positive ETCO2 and breath sounds checked- equal and bilateral Secured at: 22 cm Tube secured with: Tape Dental Injury: Teeth and Oropharynx as per pre-operative assessment  Difficulty Due To: Difficult Airway- due to anterior larynx Comments: DL X one MIL 2, visualized epiglottis only. DL X one Turk, Placed ETT with grade 2 view. VSS

## 2022-03-08 NOTE — Anesthesia Procedure Notes (Signed)
Anesthesia Regional Block: Adductor canal block   Pre-Anesthetic Checklist: , timeout performed,  Correct Patient, Correct Site, Correct Laterality,  Correct Procedure, Correct Position, site marked,  Risks and benefits discussed,  Surgical consent,  Pre-op evaluation,  At surgeon's request and post-op pain management  Laterality: Left  Prep: chloraprep       Needles:  Injection technique: Single-shot  Needle Type: Echogenic Needle     Needle Length: 9cm  Needle Gauge: 21     Additional Needles:   Procedures:,,,, ultrasound used (permanent image in chart),,    Narrative:  Start time: 03/08/2022 8:03 AM End time: 03/08/2022 8:10 AM Injection made incrementally with aspirations every 5 mL.  Performed by: Personally  Anesthesiologist: Collene Schlichter, MD  Additional Notes: No pain on injection. No increased resistance to injection. Injection made in 5cc increments.  Good needle visualization.  Patient tolerated procedure well.

## 2022-03-08 NOTE — Anesthesia Postprocedure Evaluation (Signed)
Anesthesia Post Note  Patient: Frank Cross  Procedure(s) Performed: EXCHANGE NAILING OF LEFT TIBIA (Left: Leg Lower)     Patient location during evaluation: PACU Anesthesia Type: General Level of consciousness: awake and alert Pain management: pain level controlled Vital Signs Assessment: post-procedure vital signs reviewed and stable Respiratory status: spontaneous breathing, nonlabored ventilation, respiratory function stable and patient connected to nasal cannula oxygen Cardiovascular status: blood pressure returned to baseline and stable Postop Assessment: no apparent nausea or vomiting Anesthetic complications: no   No notable events documented.  Last Vitals:  Vitals:   03/08/22 1115 03/08/22 1130  BP: (!) 139/93 (!) 132/91  Pulse: 78 91  Resp: 17 16  Temp:  36.7 C  SpO2: 93% 96%    Last Pain:  Vitals:   03/08/22 1130  TempSrc:   PainSc: 5                  Collene Schlichter

## 2022-03-08 NOTE — H&P (Signed)
[facility] (OTS) H&P   Patient ID: Frank Cross MRN: [id number] DOB/AGE: 07/04/1985 36 y.o.  Reason for Surgery: L tibial nonunion  HPI: Frank Cross is an 36 y.o. male who underwent I&D and intramedullary nailing of open tibial shaft fracture.  Unfortunately he subsequently went on to develop a nonunion.  Attempts were made with a bone stimulator to heal the fracture unfortunately continued to have pain and inability to return to her normal functional level.  He presents for exchange nailing of his left tibia.  Past Medical History:  Diagnosis Date   ADHD (attention deficit hyperactivity disorder)    Depression    Post-traumatic stress disorder     Past Surgical History:  Procedure Laterality Date   APPLICATION OF WOUND VAC Left 12/04/2020   Procedure: APPLICATION OF WOUND VAC;  Surgeon: Bjorn Pippin, MD;  Location: [facility] OR;  Service: Orthopedics;  Laterality: Left;   KNEE ARTHROSCOPY WITH ANTERIOR CRUCIATE LIGAMENT (ACL) REPAIR Left 01/26/2021   Procedure: KNEE ARTHROSCOPY WITH ANTERIOR CRUCIATE LIGAMENT (ACL) AND POSTERIOR CRUCIATE LIGAMENT (PCL) POST-LAT CORNER (PLC) REPAIRS;  Surgeon: Bjorn Pippin, MD;  Location: [facility] ORS;  Service: Orthopedics;  Laterality: Left;   TIBIA IM NAIL INSERTION Left 12/04/2020   Procedure: INTRAMEDULLARY (IM) NAIL TIBIAL;  Surgeon: Bjorn Pippin, MD;  Location: [facility] OR;  Service: Orthopedics;  Laterality: Left;   TONSILLECTOMY      History reviewed. No pertinent family history.  Social History:  reports that he has been smoking cigarettes. He has a 7.50 pack-year smoking history. He has never been exposed to tobacco smoke. He has quit using smokeless tobacco. He reports that he does not currently use alcohol. He reports that he does not use drugs.  Allergies: No Active Allergies  Medications:  Current Facility-Administered Medications on File Prior to Encounter  Medication Dose Route Frequency Provider Last Rate Last Admin    bupivacaine-epinephrine (MARCAINE W/ EPI) 0.25% -1:200000 injection   Peri-NEURAL Anesthesia Intra-op Marcene Duos, MD   30 mL at 12/15/20 1150   ropivacaine (PF) 5 mg/mL (0.5%) (NAROPIN) injection   Peri-NEURAL Anesthesia Intra-op Marcene Duos, MD   20 mL at 12/15/20 1156   Current Outpatient Medications on File Prior to Encounter  Medication Sig Dispense Refill   ibuprofen (ADVIL) 200 MG tablet Take 400 mg by mouth every 8 (eight) hours as needed (for pain.).     [DISCONTINUED] buPROPion (WELLBUTRIN XL) 150 MG 24 hr tablet Take 1 tablet (150 mg total) by mouth daily. (Patient not taking: Reported on 05/13/2015) 30 tablet 0   [DISCONTINUED] traZODone (DESYREL) 50 MG tablet Take 1 tablet (50 mg total) by mouth at bedtime and may repeat dose one time if needed. (Patient not taking: No sig reported) 30 tablet 0     ROS: Constitutional: No fever or chills Vision: No changes in vision ENT: No difficulty swallowing CV: No chest pain Pulm: No SOB or wheezing GI: No nausea or vomiting GU: No urgency or inability to hold urine Skin: No poor wound healing Neurologic: No numbness or tingling Psychiatric: No depression or anxiety Heme: No bruising Allergic: No reaction to medications or food   Exam: Blood pressure 113/70, pulse 65, temperature 97.8 F (36.6 C), temperature source Oral, resp. rate 17, height 5\' 7"  (1.702 m), weight 78 kg, SpO2 97 %. General: No acute distress Orientation: Awake alert and oriented x 3 Mood and Affect: Cooperative and pleasant Gait: Within normal limits but does have antalgic gait on the left side Coordination  and balance: Within normal limits  Left lower extremity reveals well-healed incisions.  Tenderness over his nonunion site.  Motor and sensory function intact to the lower extremity.  Right lower extremity skin without lesions. No tenderness to palpation. Full painless ROM, full strength in each muscle groups without evidence of  instability.   Medical Decision Making: Data: Imaging: X-rays and CT scan are reviewed which shows a persistent nonunion through the tibial shaft region no signs of any hardware failure or loosening.  Labs:  Results for orders placed or performed during the hospital encounter of 03/08/22 (from the past 24 hour(s))  CBC WITH DIFFERENTIAL     Status: Abnormal   Collection Time: 03/08/22  7:38 AM  Result Value Ref Range   WBC 8.6 4.0 - 10.5 K/uL   RBC 4.17 (L) 4.22 - 5.81 MIL/uL   Hemoglobin 12.7 (L) 13.0 - 17.0 g/dL   HCT 25.6 (L) 38.9 - 37.3 %   MCV 88.7 80.0 - 100.0 fL   MCH 30.5 26.0 - 34.0 pg   MCHC 34.3 30.0 - 36.0 g/dL   RDW 42.8 76.8 - 11.5 %   Platelets 312 150 - 400 K/uL   nRBC 0.0 0.0 - 0.2 %   Neutrophils Relative % 62 %   Neutro Abs 5.4 1.7 - 7.7 K/uL   Lymphocytes Relative 23 %   Lymphs Abs 2.0 0.7 - 4.0 K/uL   Monocytes Relative 9 %   Monocytes Absolute 0.7 0.1 - 1.0 K/uL   Eosinophils Relative 5 %   Eosinophils Absolute 0.4 0.0 - 0.5 K/uL   Basophils Relative 1 %   Basophils Absolute 0.1 0.0 - 0.1 K/uL   Immature Granulocytes 0 %   Abs Immature Granulocytes 0.02 0.00 - 0.07 K/uL     Imaging or Labs ordered: None  Medical history and chart was reviewed and case discussed with medical provider.  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  I discussed the possibility of bleeding, infection, persistent nonunion, hardware failure, hardware irritation, nerve or blood vessel injury, need for further surgery, even the possibility anesthetic complications.  He agreed to proceed with surgery and consent was obtained.  Roby Lofts, MD [facility] [phone] (office) [email or web address]

## 2022-03-09 ENCOUNTER — Other Ambulatory Visit (HOSPITAL_COMMUNITY): Payer: Self-pay

## 2022-03-09 ENCOUNTER — Other Ambulatory Visit: Payer: Self-pay

## 2022-03-09 ENCOUNTER — Emergency Department (HOSPITAL_COMMUNITY)
Admission: EM | Admit: 2022-03-09 | Discharge: 2022-03-10 | Disposition: A | Discharge status: Home / Self Care | Payer: Medicaid Other | Attending: Emergency Medicine | Admitting: Emergency Medicine

## 2022-03-09 ENCOUNTER — Encounter (HOSPITAL_COMMUNITY): Payer: Self-pay

## 2022-03-09 ENCOUNTER — Emergency Department (HOSPITAL_COMMUNITY): Payer: Medicaid Other

## 2022-03-09 DIAGNOSIS — M79605 Pain in left leg: Secondary | ICD-10-CM | POA: Diagnosis present

## 2022-03-09 MED ORDER — OXYCODONE-ACETAMINOPHEN 5-325 MG PO TABS
1.0000 | ORAL_TABLET | Freq: Once | ORAL | Status: AC
  Administered 2022-03-09: 1 via ORAL
  Filled 2022-03-09: qty 1

## 2022-03-09 NOTE — ED Triage Notes (Signed)
Pt had left tibial nail exchange yesterday morning.  Pt states that since then pain has been increasing and bleeding has hardened cast.  +PMS intact distal to injury. Pt states he called and was told to take more pain medication. Pt states he is supposed to be weight bearing, but has not been able to.

## 2022-03-09 NOTE — ED Provider Notes (Signed)
[facility] EMERGENCY DEPARTMENT Provider Note   CSN: [id number] Arrival date & time: 03/09/22  2042     History Chief Complaint  Patient presents with   Leg Pain    Frank Cross is a 36 y.o. male.   Leg Pain Associated symptoms: no fatigue and no fever   Patient presents with complaints of left leg pain. Patient underwent exchange nailing of left tibia with Dr. Jena Gauss and reports that his pain was minimal following procedure but has worsened significantly today. Patient reportedly experienced a tibia fibula fracture several months back and has undergone multiple surgeries attempting to resolve issues of nonunion. He reports minimal use of percocet due to previous substance use history and has been trying to ride out the pain. Denies fever, chest pain, shortness of breath, leg numbness, or loss of motor function.    Home Medications Prior to Admission medications   Medication Sig Start Date End Date Taking? Authorizing Provider  aspirin EC 81 MG tablet Take 1 tablet (81 mg total) by mouth daily. Swallow whole. 03/08/22 04/07/22  [place] Bali, PA-C  ibuprofen (ADVIL) 200 MG tablet Take 400 mg by mouth every 8 (eight) hours as needed (for pain.).    [provider]  methocarbamol (ROBAXIN) 500 MG tablet Take 1 tablet (500 mg total) by mouth every 6 (six) hours as needed for muscle spasms. 03/08/22   [place] Bali, PA-C  oxyCODONE-acetaminophen (PERCOCET) 5-325 MG tablet Take 1 tablet by mouth every 4 (four) hours as needed for severe pain. 03/08/22   [place]ali, PA-C  buPROPion (WELLBUTRIN XL) 150 MG 24 hr tablet Take 1 tablet (150 mg total) by mouth daily. Patient not taking: Reported on 05/13/2015 04/24/13 05/13/15  Beau Fanny, FNP  traZODone (DESYREL) 50 MG tablet Take 1 tablet (50 mg total) by mouth at bedtime and may repeat dose one time if needed. Patient not taking: No sig reported 04/24/13 05/13/15  Beau Fanny, FNP      Allergies     Patient has no known allergies.    Review of Systems   Review of Systems  Constitutional:  Negative for fatigue and fever.  Cardiovascular:  Negative for leg swelling.  Gastrointestinal:  Negative for abdominal pain, diarrhea, nausea and vomiting.  Musculoskeletal:  Positive for joint swelling.  Skin:  Positive for wound.  All other systems reviewed and are negative.   Physical Exam Updated Vital Signs BP (!) 142/89 (BP Location: Right Arm)   Pulse 100   Temp 98.7 F (37.1 C)   Resp 20   Ht 5\' 7"  (1.702 m)   Wt 78 kg   SpO2 100%   BMI 26.94 kg/m  Physical Exam Vitals and nursing note reviewed.  Constitutional:      Appearance: Normal appearance. He is normal weight.     Comments: Patient uncomfortable on exam bed  HENT:     Head: Normocephalic and atraumatic.  Eyes:     Conjunctiva/sclera: Conjunctivae normal.  Cardiovascular:     Rate and Rhythm: Normal rate and regular rhythm.     Pulses: Normal pulses.     Heart sounds: Normal heart sounds.  Pulmonary:     Effort: Pulmonary effort is normal.     Breath sounds: Normal breath sounds.  Musculoskeletal:        General: Tenderness present.     Cervical back: Normal range of motion.     Comments: Multiple surgical sites on left lower leg with bloody steri  strips. Medial malleolus was most tender, but no obvious deformities seen.  Skin:    General: Skin is warm and dry.     Capillary Refill: Capillary refill takes less than 2 seconds.  Neurological:     Mental Status: He is alert.     Comments: No loss of sensation of strength in left lower leg. Pain with any ankle movement.     ED Results / Procedures / Treatments   Labs (all labs ordered are listed, but only abnormal results are displayed) Labs Reviewed - No data to display  EKG None  Radiology DG Tibia/Fibula Left  Result Date: 03/09/2022 CLINICAL DATA:  Left leg pain, tibial nonunion status post tibial nail exchange 03/08/2022. EXAM: LEFT TIBIA AND  FIBULA - 2 VIEW COMPARISON:  03/08/2022 FINDINGS: Left tibial ORIF has been performed with an intra measure lap with proximal and distal interlocking screws. Mid diaphyseal tibial fracture again noted with incomplete bridging callus again identified. There is a relatively acute appearing fracture of the distal left bull lateral tibial diaphyseal cortex adjacent to the distal interlocking screw best seen on frontal radiograph with proximally 1 cortical with lateral displacement of the extra-articular cortical fracture fragment. Incompletely healed mid diaphyseal fracture of the fibula again noted with stable alignment. Postsurgical changes of anterior and posterior cruciate ligament reconstruction again noted IMPRESSION: 1. Status post left tibial ORIF. Stable incompletely healed fractures of the mid diaphysis of the left tibia and fibula with unchanged alignment. 2. Acute appearing fracture of the distal left lateral tibial diaphyseal cortex adjacent to the distal interlocking screw. Electronically Signed   By: Helyn Numbers M.D.   On: 03/09/2022 21:37   DG Tibia/Fibula Left Port  Result Date: 03/08/2022 CLINICAL DATA:  Fracture, postop. EXAM: PORTABLE LEFT TIBIA AND FIBULA - 2 VIEW COMPARISON:  Radiograph 12/04/2020 FINDINGS: Tibial intramedullary nail. New proximal and distal locking screws with ghost tracks at prior screw site. New from prior radiograph surgical hardware from presumed knee ligamentous repair. Unchanged alignment of mid tibial shaft fracture with some incomplete bony bridging. Unchanged alignment of mid fibular shaft fracture with interval callus formation. Recent postsurgical change includes air and edema in the soft tissues. IMPRESSION: Tibial intramedullary nail. Unchanged alignment of mid tibial shaft fracture with incomplete callus formation. New proximal and distal locking screws with ghost tracks at prior screw sites. Presumed ligamentous repair about the knee. Electronically Signed    By: Narda Rutherford M.D.   On: 03/08/2022 11:37   DG Tibia/Fibula Left  Result Date: 03/08/2022 CLINICAL DATA:  Left tibial nail exchange EXAM: LEFT TIBIA AND FIBULA - 2 VIEW COMPARISON:  Left lower extremity radiographs dated December 04, 2020 FINDINGS: Fluoroscopic images were obtained intraoperatively and submitted for post operative interpretation. Images demonstrate left tibial nail exchange hardware in expected position, 10 images were obtained with 169 seconds of fluoroscopy time and 5.95 mGy. Old comminuted fractures of the tibia and fibula. Please see the performing provider's procedural report for further detail. IMPRESSION: Intraoperative fluoroscopic guidance for left tibial nail exchange. Electronically Signed   By: Allegra Lai M.D.   On: 03/08/2022 10:49   DG C-Arm 1-60 Min-No Report  Result Date: 03/08/2022 Fluoroscopy was utilized by the requesting physician.  No radiographic interpretation.   DG C-Arm 1-60 Min-No Report  Result Date: 03/08/2022 Fluoroscopy was utilized by the requesting physician.  No radiographic interpretation.    Procedures Procedures   Medications Ordered in ED Medications - No data to display  ED Course/ Medical Decision  Making/ A&P                           Medical Decision Making  This patient presents to the ED for concern of left leg pain. Differential diagnosis includes compartment syndrome, cellulitis, ankle fracture, osteomyelitis, and ankle sprain   Imaging Studies ordered:  I ordered imaging studies including xray of left tiba/fibula I independently visualized and interpreted imaging which showed orthopedic hardware in left leg. Acute appearing fracture of the distal left lateral tibial diaphyseal cortex I agree with the radiologist interpretation   Medicines ordered and prescription drug management:  I have reviewed the patients home medicines and have made adjustments as needed   Problem List / ED Course:  Patient was  seen in the ER due to left leg pain. Patient had undergone an exchange nailing in that left leg. Based on physical exam, there was no obvious signs of infection but patient was tender throughout leg. He had good mobility once old dressing was removed which seemed to be a good portion of his discomfort. Advised patient that based on xray of ankle, he should avoid bearing weight and follow up with Dr. Jena Gauss for further follow up and management. Patient was agreeable to this plan and was reassured that there was no signs of infection on exam today.   Final Clinical Impression(s) / ED Diagnoses Final diagnoses:  Left leg pain    Rx / DC Orders ED Discharge Orders     None         Salomon Mast 03/09/22 2328    Eber Hong, MD 03/12/22 [phone]

## 2022-03-09 NOTE — Discharge Instructions (Addendum)
You were seen in the ER today for left leg pain. Based on your exam, there does not appear to be any infection present at this time. Your xrays show a possible cortical fracture around the area that is most painful. Avoid bearing weight on left foot until you are able to follow up with orthopedics to further evaluate your leg. It would be best if you can see them sooner than your scheduled 2 week follow up. Manage pain as needed with Percocet, Ibuprofen, and Robaxin.

## 2022-03-09 NOTE — ED Provider Triage Note (Signed)
Emergency Medicine Provider Triage Evaluation Note  Frank Cross , a 36 y.o. male  was evaluated in triage.  Pt complains of left leg pain.  He had a surgery yesterday on his tibia fibula for fracture.  He was discharged home on Percocet fives.  He says that ever since then the cast was hardened and he is not able to bend his left knee and has had excruciating pain on his left shin all the way down to his left ankle.  He says he is not able to bear weight on it and he is supposedly supposed to be able to.  He said he called his orthopedic office and they just told him to take more pain medication.  He does state he has a history of narcotic use, and does not want to continue to increase the pain meds..  Review of Systems  Positive:  Negative:   Physical Exam  BP (!) 142/89 (BP Location: Right Arm)   Pulse 100   Temp 98.7 F (37.1 C)   Resp 20   Ht 5\' 7"  (1.702 m)   Wt 78 kg   SpO2 100%   BMI 26.94 kg/m  Gen:   Awake, no distress   Resp:  Normal effort  MSK:   Moves extremities without difficulty  Other:  Dressing in place.  Able to wiggle toes, good capillary refill, foot is warm, I am able to palpate pedal pulse which is 2+  Medical Decision Making  Medically screening exam initiated at 9:02 PM.  Appropriate orders placed.  Frank Cross was informed that the remainder of the evaluation will be completed by another provider, this initial triage assessment does not replace that evaluation, and the importance of remaining in the ED until their evaluation is complete.     Carlton Adam, Claudie Leach 03/09/22 2104

## 2022-03-09 NOTE — ED Provider Notes (Signed)
This patient is a appearing 36 year old male who unfortunately has suffered with a fairly significant injury to his left lower extremity including a fracture of his tibia with a nonunion.  He has undergone multiple surgical procedures including yesterday undergoing a tibial nail exchange, this was done by Dr. Jena Gauss, the patient was feeling good yesterday but this morning had increasing pain especially down to the ankle.  The dressing was removed here and appears very good, there is a small amount of dried blood on the dressing and the Steri-Strips but otherwise the patient's ankle and leg look good with a soft compartment in the tibial fibular areas circumferentially.  He has good range of motion of the knee considering the postop status, there is no warmth no redness no cellulitis no foul drainage, no fever and he is not tachycardic.  He has been trying to avoid opiate use because of his history of substance abuse, he does not have a follow-up for 2 weeks, I think this is reasonable but may be good to be seen sooner given the amount of pain.  X-rays were reassuring.  Will reach out to orthopedic surgeon by private message, patient is agreeable  The patient does have what appears to be some cortical disruption and possible tibial fracture which appears acute on the distal left lateral tibial diaphyseal cortex at the location of the interlocking screw.  Will keep the patient nonweightbearing with knee immobilizer and crutches until follow-up with orthopedics  Medical screening examination/treatment/procedure(s) were conducted as a shared visit with non-physician practitioner(s) and myself.  I personally evaluated the patient during the encounter.  Clinical Impression:   Final diagnoses:  None         Eber Hong, MD 03/12/22 [phone]

## 2022-03-10 NOTE — Progress Notes (Signed)
Orthopedic Tech Progress Note Patient Details:  Frank Cross 01-14-1986 [id number]  Ortho Devices Type of Ortho Device: Knee Immobilizer Ortho Device/Splint Location: lle Ortho Device/Splint Interventions: Ordered, Application, Adjustment   Post Interventions Patient Tolerated: Well Instructions Provided: Care of device, Adjustment of device  Trinna Post 03/10/2022, 4:44 AM

## 2022-03-15 ENCOUNTER — Encounter (HOSPITAL_COMMUNITY): Payer: Self-pay | Admitting: Student

## 2022-09-11 ENCOUNTER — Other Ambulatory Visit: Payer: Self-pay | Admitting: Student

## 2022-09-11 DIAGNOSIS — S82872K Displaced pilon fracture of left tibia, subsequent encounter for closed fracture with nonunion: Secondary | ICD-10-CM

## 2022-09-12 ENCOUNTER — Ambulatory Visit
Admission: RE | Admit: 2022-09-12 | Discharge: 2022-09-12 | Disposition: A | Discharge status: Home / Self Care | Payer: Medicaid Other | Source: Ambulatory Visit | Attending: Student | Admitting: Student

## 2022-09-12 DIAGNOSIS — S82872K Displaced pilon fracture of left tibia, subsequent encounter for closed fracture with nonunion: Secondary | ICD-10-CM

## 2022-12-30 ENCOUNTER — Encounter (HOSPITAL_COMMUNITY): Payer: Self-pay | Admitting: Orthopaedic Surgery
